# Patient Record
Sex: Male | Born: 1960
Health system: Southern US, Community
[De-identification: ages and names within clinical notes are randomized; demographics above are authoritative.]

## PROBLEM LIST (undated history)

## (undated) ENCOUNTER — Ambulatory Visit (HOSPITAL_COMMUNITY): Discharge status: Absent without leave | Payer: No Typology Code available for payment source

## (undated) DIAGNOSIS — E785 Hyperlipidemia, unspecified: Secondary | ICD-10-CM

## (undated) DIAGNOSIS — M1990 Unspecified osteoarthritis, unspecified site: Secondary | ICD-10-CM

## (undated) DIAGNOSIS — I10 Essential (primary) hypertension: Secondary | ICD-10-CM

## (undated) DIAGNOSIS — K219 Gastro-esophageal reflux disease without esophagitis: Secondary | ICD-10-CM

## (undated) HISTORY — PX: OTHER SURGICAL HISTORY: SHX169

## (undated) HISTORY — DX: Gastro-esophageal reflux disease without esophagitis: K21.9

## (undated) HISTORY — DX: Essential (primary) hypertension: I10

## (undated) HISTORY — DX: Hyperlipidemia, unspecified: E78.5

---

## 2007-01-26 ENCOUNTER — Emergency Department (HOSPITAL_COMMUNITY): Admission: EM | Admit: 2007-01-26 | Discharge: 2007-01-26 | Payer: Self-pay | Admitting: Emergency Medicine

## 2007-01-28 ENCOUNTER — Emergency Department (HOSPITAL_COMMUNITY): Admission: EM | Admit: 2007-01-28 | Discharge: 2007-01-28 | Payer: Self-pay | Admitting: Emergency Medicine

## 2007-11-25 ENCOUNTER — Emergency Department (HOSPITAL_COMMUNITY): Admission: EM | Admit: 2007-11-25 | Discharge: 2007-11-25 | Payer: Self-pay | Admitting: Family Medicine

## 2008-02-23 ENCOUNTER — Emergency Department (HOSPITAL_COMMUNITY): Admission: EM | Admit: 2008-02-23 | Discharge: 2008-02-23 | Payer: Self-pay | Admitting: Family Medicine

## 2008-02-26 ENCOUNTER — Emergency Department (HOSPITAL_COMMUNITY): Admission: EM | Admit: 2008-02-26 | Discharge: 2008-02-27 | Payer: Self-pay | Admitting: Emergency Medicine

## 2009-04-01 ENCOUNTER — Emergency Department (HOSPITAL_COMMUNITY): Admission: EM | Admit: 2009-04-01 | Discharge: 2009-04-01 | Payer: Self-pay | Admitting: Family Medicine

## 2010-03-26 ENCOUNTER — Emergency Department (HOSPITAL_COMMUNITY): Admission: EM | Admit: 2010-03-26 | Discharge: 2010-03-26 | Payer: Self-pay | Admitting: Emergency Medicine

## 2010-08-20 LAB — POCT URINALYSIS DIP (DEVICE)
Glucose, UA: NEGATIVE mg/dL
Protein, ur: NEGATIVE mg/dL
Urobilinogen, UA: 2 mg/dL — ABNORMAL HIGH (ref 0.0–1.0)

## 2011-02-17 LAB — LIPASE, BLOOD: Lipase: 36

## 2011-02-17 LAB — POCT H PYLORI SCREEN: H. PYLORI SCREEN, POC: NEGATIVE

## 2011-02-17 LAB — URINALYSIS, ROUTINE W REFLEX MICROSCOPIC
Bilirubin Urine: NEGATIVE
Ketones, ur: NEGATIVE
Nitrite: NEGATIVE
Protein, ur: NEGATIVE
Urobilinogen, UA: 1

## 2011-02-17 LAB — COMPREHENSIVE METABOLIC PANEL
Albumin: 4.3
Alkaline Phosphatase: 50
BUN: 12
Calcium: 9.4
Creatinine, Ser: 0.97
Glucose, Bld: 114 — ABNORMAL HIGH
Total Protein: 7.5

## 2011-02-26 ENCOUNTER — Emergency Department (HOSPITAL_COMMUNITY)
Admission: EM | Admit: 2011-02-26 | Discharge: 2011-02-26 | Disposition: A | Discharge status: Home / Self Care | Payer: Self-pay | Attending: Emergency Medicine | Admitting: Emergency Medicine

## 2011-02-26 DIAGNOSIS — L02419 Cutaneous abscess of limb, unspecified: Secondary | ICD-10-CM | POA: Insufficient documentation

## 2011-02-26 DIAGNOSIS — L03119 Cellulitis of unspecified part of limb: Secondary | ICD-10-CM | POA: Insufficient documentation

## 2011-03-20 ENCOUNTER — Emergency Department (HOSPITAL_COMMUNITY)
Admission: EM | Admit: 2011-03-20 | Discharge: 2011-03-20 | Disposition: A | Discharge status: Home / Self Care | Payer: Self-pay | Attending: Emergency Medicine | Admitting: Emergency Medicine

## 2011-03-20 DIAGNOSIS — L02419 Cutaneous abscess of limb, unspecified: Secondary | ICD-10-CM | POA: Insufficient documentation

## 2011-03-20 DIAGNOSIS — L03119 Cellulitis of unspecified part of limb: Secondary | ICD-10-CM | POA: Insufficient documentation

## 2011-03-20 DIAGNOSIS — M79609 Pain in unspecified limb: Secondary | ICD-10-CM | POA: Insufficient documentation

## 2011-10-14 ENCOUNTER — Emergency Department (HOSPITAL_COMMUNITY)
Admission: EM | Admit: 2011-10-14 | Discharge: 2011-10-14 | Disposition: A | Discharge status: Home / Self Care | Payer: Self-pay | Attending: Emergency Medicine | Admitting: Emergency Medicine

## 2011-10-14 ENCOUNTER — Encounter (HOSPITAL_COMMUNITY): Payer: Self-pay | Admitting: *Deleted

## 2011-10-14 DIAGNOSIS — K0889 Other specified disorders of teeth and supporting structures: Secondary | ICD-10-CM

## 2011-10-14 DIAGNOSIS — K029 Dental caries, unspecified: Secondary | ICD-10-CM | POA: Insufficient documentation

## 2011-10-14 DIAGNOSIS — K089 Disorder of teeth and supporting structures, unspecified: Secondary | ICD-10-CM | POA: Insufficient documentation

## 2011-10-14 LAB — SALICYLATE LEVEL: Salicylate Lvl: 14.2 mg/dL (ref 2.8–20.0)

## 2011-10-14 MED ORDER — OXYCODONE-ACETAMINOPHEN 5-325 MG PO TABS: 1.0000 | ORAL_TABLET | Freq: Once | ORAL | Status: DC

## 2011-10-14 MED ORDER — OXYCODONE-ACETAMINOPHEN 5-325 MG PO TABS
1.0000 | ORAL_TABLET | Freq: Once | ORAL | Status: AC
  Administered 2011-10-14: 1 via ORAL
  Filled 2011-10-14: qty 1

## 2011-10-14 MED ORDER — OXYCODONE-ACETAMINOPHEN 5-325 MG PO TABS: 1.0000 | ORAL_TABLET | ORAL | Status: AC | PRN

## 2011-10-14 MED ORDER — PENICILLIN V POTASSIUM 500 MG PO TABS: 500.0000 mg | ORAL_TABLET | Freq: Four times a day (QID) | ORAL | Status: DC

## 2011-10-14 MED ORDER — PENICILLIN V POTASSIUM 500 MG PO TABS: 500.0000 mg | ORAL_TABLET | Freq: Three times a day (TID) | ORAL | Status: DC

## 2011-10-14 MED ORDER — PENICILLIN V POTASSIUM 500 MG PO TABS: 500.0000 mg | ORAL_TABLET | Freq: Four times a day (QID) | ORAL | Status: AC

## 2011-10-14 NOTE — ED Notes (Signed)
C.o toothache onset several days ago . States he took 6 bc powders in the last several hours.

## 2011-10-14 NOTE — ED Provider Notes (Signed)
History     CSN: [id number]  Arrival date & time 10/14/11  3244   First MD Initiated Contact with Patient 10/14/11 [phone]      Chief Complaint  Patient presents with  . Dental Pain    (Consider location/radiation/quality/duration/timing/severity/associated sxs/prior treatment) HPI  Patient presents to the Emergency Department with complaints of dental pain at the right lower jaw. He denies having a history of dental pain but does have dental disease. He admits to taking 6 BC powders over the past 5 hours, 1 hour in between each pack. He denies taking any other medications. The patient took 1 BC powder every 5 hours until he decided to come to the ED. He states he was not trying to overdose he was trying not to be in pain, he denies having any abdominal pain, vomiting, nausea, diarrhea, history of ulcers or bleeding disorders. Pt able to open mouth and eat without any difficulty but does have pain. Pt appears uncomfortable but non toxic. VSS.    History reviewed. No pertinent past medical history.  History reviewed. No pertinent past surgical history.  History reviewed. No pertinent family history.  History  Substance Use Topics  . Smoking status: Current Some Day Smoker  . Smokeless tobacco: Not on file  . Alcohol Use: Yes      Review of Systems   HEENT: denies blurry vision or change in hearing PULMONARY: Denies difficulty breathing and SOB CARDIAC: denies chest pain or heart palpitations MUSCULOSKELETAL:  denies being unable to ambulate ABDOMEN AL: denies abdominal pain GU: denies loss of bowel or urinary control NEURO: denies numbness and tingling in extremities   Allergies  Review of patient's allergies indicates no known allergies.  Home Medications   Current Outpatient Rx  Name Route Sig Dispense Refill  . BC HEADACHE POWDER PO Oral Take 1 packet by mouth daily as needed. For pain    . PENICILLIN V POTASSIUM 500 MG PO TABS Oral Take 1 tablet (500 mg total)  by mouth 4 (four) times daily. 40 tablet 0  . PENICILLIN V POTASSIUM 500 MG PO TABS Oral Take 1 tablet (500 mg total) by mouth 3 (three) times daily. 30 tablet 0    BP 129/95  Pulse 87  Temp(Src) 99.1 F (37.3 C) (Oral)  Resp 22  SpO2 100%  Physical Exam  Nursing note and vitals reviewed. Constitutional: He appears well-developed and well-nourished.  HENT:  Head: Normocephalic and atraumatic.  Mouth/Throat: Dental caries present.  Eyes: Conjunctivae and EOM are normal. Pupils are equal, round, and reactive to light.  Neck: Normal range of motion. Neck supple.  Cardiovascular: Normal rate and regular rhythm.   Pulmonary/Chest: Effort normal and breath sounds normal.    ED Course  Procedures (including critical care time)   Labs Reviewed  SALICYLATE LEVEL   No results found.   1. Pain, dental       MDM  Salicylate level ordered and result is 14.2. Pt given a lot of patient education about appropriate use of medications and how overdosing is very dangerous. Case discussed with Dr. Hyacinth Meeker.  Pt given Rx for Percocets 5-325 (10 tabs) and Penicillin. Patient informed that they need to find a dentist and have the tooth pulled or the symptoms may be reoccurring. A Resource guide has been given with dental providers. Patient has been given return to ED precautions.         Dorthula Matas, PA 10/14/11 [phone]

## 2011-10-14 NOTE — Discharge Instructions (Signed)
Dental Pain A tooth ache may be caused by cavities (tooth decay). Cavities expose the nerve of the tooth to air and hot or cold temperatures. It may come from an infection or abscess (also called a boil or furuncle) around your tooth. It is also often caused by dental caries (tooth decay). This causes the pain you are having. DIAGNOSIS  Your caregiver can diagnose this problem by exam. TREATMENT   If caused by an infection, it may be treated with medications which kill germs (antibiotics) and pain medications as prescribed by your caregiver. Take medications as directed.   Only take over-the-counter or prescription medicines for pain, discomfort, or fever as directed by your caregiver.   Whether the tooth ache today is caused by infection or dental disease, you should see your dentist as soon as possible for further care.  SEEK MEDICAL CARE IF: The exam and treatment you received today has been provided on an emergency basis only. This is not a substitute for complete medical or dental care. If your problem worsens or new problems (symptoms) appear, and you are unable to meet with your dentist, call or return to this location. SEEK IMMEDIATE MEDICAL CARE IF:   You have a fever.   You develop redness and swelling of your face, jaw, or neck.   You are unable to open your mouth.   You have severe pain uncontrolled by pain medicine.  MAKE SURE YOU:   Understand these instructions.   Will watch your condition.   Will get help right away if you are not doing well or get worse.  Document Released: 05/04/2005 Document Revised: 04/23/2011 Document Reviewed: 12/21/2007 ExitCare Patient Information 2012 [employer].  RESOURCE GUIDE  Dental Problems  Patients with Medicaid: [facility]                     [facility] [address]                                           [address] Phone:  [phone]                                                   Phone:  [phone]  If unable to pay or uninsured, contact:  [facility] or [facility] to become qualified for the adult dental clinic.  Chronic Pain Problems Contact [facility]  [phone] Patients need to be referred by their primary care doctor.  Insufficient Money for Medicine Contact [employer]:  call "211" or [facility] 271-5999.  No Primary Care Doctor Call [facility]  832-8000 Other agencies that provide inexpensive medical care    [facility]  832-8035    [facility]  [phone]    [facility]  [phone]    [facility]  832-4777    [facility]  [phone]    [facility]  [phone]  Psychological Services [facility]  [phone] [facility]  [phone] [facility]   800 853-5163 (emergency services 641-4993)  Substance Abuse Resources [facility]  336-882-2125 [facility] 336-784-9470 [facility] 336-285-9073 [facility]   336-845-3988 Residential & Outpatient Substance Abuse Program  800-659-3381  Abuse/Neglect [place] Child Abuse Hotline (336) 641-3795 [place] [facility] Abuse Hotline 800-378-5315 (After Hours)  Emergency Shelter [facility] (336) 271-5985  [facility] (336) 275-9566 [facility] (704) 372-4663  MRSA Hotline #:   832-7006    [place] [facility]  [facility] [place]     [employer]                          [facility] [address] [place]                       [address]      [address]  [place]                                                [place]                            [place] Phone:  [phone]                                   Phone:  [phone]                 Phone:  [phone]  [facility] Phone:   [phone]  [place] Child Abuse Hotline (336) 342-1394 (336) 342-3537 (After Hours)   

## 2011-10-15 NOTE — ED Provider Notes (Signed)
Medical screening examination/treatment/procedure(s) were performed by non-physician practitioner and as supervising physician I was immediately available for consultation/collaboration.    Vida Roller, MD 10/15/11 [phone]

## 2012-02-10 ENCOUNTER — Encounter (HOSPITAL_COMMUNITY): Payer: Self-pay | Admitting: Adult Health

## 2012-02-10 ENCOUNTER — Emergency Department (HOSPITAL_COMMUNITY)
Admission: EM | Admit: 2012-02-10 | Discharge: 2012-02-11 | Disposition: A | Discharge status: Home / Self Care | Payer: Self-pay | Attending: Emergency Medicine | Admitting: Emergency Medicine

## 2012-02-10 DIAGNOSIS — F17200 Nicotine dependence, unspecified, uncomplicated: Secondary | ICD-10-CM | POA: Insufficient documentation

## 2012-02-10 DIAGNOSIS — K089 Disorder of teeth and supporting structures, unspecified: Secondary | ICD-10-CM | POA: Insufficient documentation

## 2012-02-10 DIAGNOSIS — K0889 Other specified disorders of teeth and supporting structures: Secondary | ICD-10-CM

## 2012-02-10 DIAGNOSIS — K029 Dental caries, unspecified: Secondary | ICD-10-CM | POA: Insufficient documentation

## 2012-02-10 MED ORDER — ONDANSETRON 4 MG PO TBDP
ORAL_TABLET | ORAL | Status: AC
  Administered 2012-02-10: 8 mg via ORAL
  Filled 2012-02-10: qty 2

## 2012-02-10 MED ORDER — ONDANSETRON 4 MG PO TBDP
8.0000 mg | ORAL_TABLET | Freq: Once | ORAL | Status: AC
  Administered 2012-02-10: 8 mg via ORAL

## 2012-02-10 NOTE — ED Notes (Signed)
Pt c/o toothache x3days, states it is making him nauseous and he can't eat food. Pt states he has dentist appt Friday but can't wait-is in too much pain. No swelling noted. Pt states pain is on right side, doesn't know if it is upper or lower.

## 2012-02-10 NOTE — ED Notes (Signed)
Reports taking Ibuprofen and 2 days of PCN for toothache and constant use of orajel. C/o upset stomach and pain on upper and lower back teeth. Has been using 200 mg IBuprofen and taking 1-2 pills every few hours. Has not been able to eat due to tooth pain.

## 2012-02-10 NOTE — ED Provider Notes (Signed)
History     CSN: [id number]  Arrival date & time 02/10/12  2043   First MD Initiated Contact with Patient 02/10/12 2205      Chief Complaint  Patient presents with  . Dental Pain   HPI  History provided by the patient. Patient is a 51 year old male with no significant PMH presents with complaints of right lower dental pain. Patient states pain has been increasing for the past several days. He has past history of off-and-on pains to his teeth. Patient states that he does have an appointment with the dentist this Friday. He has been taking penicillin ibuprofen and Orajel. This has helped some but not significantly tonight. Wait to see the tenderness because pain is too severe. He denies any swelling of the gums are common. Denies any fever, chills or sweats.    History reviewed. No pertinent past medical history.  History reviewed. No pertinent past surgical history.  History reviewed. No pertinent family history.  History  Substance Use Topics  . Smoking status: Current Some Day Smoker  . Smokeless tobacco: Not on file  . Alcohol Use: Yes      Review of Systems  Constitutional: Negative for fever and chills.  HENT: Positive for dental problem. Negative for trouble swallowing and voice change.   Gastrointestinal: Positive for nausea. Negative for vomiting.    Allergies  Review of patient's allergies indicates no known allergies.  Home Medications   Current Outpatient Rx  Name Route Sig Dispense Refill  . BENZOCAINE 10 % MT GEL Mouth/Throat Use as directed 1 application in the mouth or throat 3 (three) times daily as needed. For pain    . IBUPROFEN 200 MG PO TABS Oral Take 400 mg by mouth every 6 (six) hours as needed. For pain    . PENICILLIN V POTASSIUM 500 MG PO TABS Oral Take 500 mg by mouth 4 (four) times daily.      BP 133/95  Pulse 78  Temp 98.6 F (37 C) (Oral)  Resp 16  SpO2 97%  Physical Exam  Nursing note and vitals reviewed. Constitutional: He is  oriented to person, place, and time. He appears well-developed and well-nourished.  HENT:  Head: Normocephalic.  Mouth/Throat:         Poor dentition throughout. Multiple dental caries present. There is significant decay of the right lower second premolar to the gumline. There is tenderness around this area and to adjacent teeth.   Patient also with pain to percussion of right upper second molar area. No swelling of adjacent gums or mouth.  No significant swelling of the gums and no swelling of the tongue.  Neck: Normal range of motion.  Cardiovascular: Normal rate and regular rhythm.   Pulmonary/Chest: Effort normal and breath sounds normal.  Lymphadenopathy:    He has no cervical adenopathy.  Neurological: He is alert and oriented to person, place, and time.  Skin: Skin is warm.  Psychiatric: He has a normal mood and affect. His behavior is normal.    ED Course  Procedures   Dental Block Performed by: Angus Seller Authorized by: Angus Seller Consent: Verbal consent obtained. Risks and benefits: risks, benefits and alternatives were discussed Consent given by: patient Patient identity confirmed: provided demographic data  Location: Right lower second premolar  Local anesthetic: Bupivacaine 0.5% with epinephrine  Anesthetic total: 1.8 ml  Irrigation method: syringe  Patient tolerance: Patient tolerated the procedure well with no immediate complications. Pain improved.  Dental Block Performed by: Angus Seller Authorized  by: Isaih Bulger S Consent: Verbal consent obtained. Risks and benefits: risks, benefits and alternatives were discussed Consent given by: patient Patient identity confirmed: provided demographic data  Location: Right upper second molar  Local anesthetic: Bupivacaine 0.5% with epinephrine  Anesthetic total: 1.8 ml  Irrigation method: syringe  Patient tolerance: Patient tolerated the procedure well with no immediate complications. Pain  improved.       1. Pain, dental       MDM  11:40PM patient seen and evaluated. Patient given dental block with good improvement of pain. Patient reports one of his dentist this Friday.        Angus Seller, [place] 02/11/12 [phone]

## 2012-02-11 MED ORDER — HYDROCODONE-ACETAMINOPHEN 5-325 MG PO TABS: 1.0000 | ORAL_TABLET | ORAL | Status: DC | PRN

## 2012-02-11 MED ORDER — ONDANSETRON 8 MG PO TBDP: ORAL_TABLET | ORAL | Status: DC

## 2012-02-11 NOTE — ED Provider Notes (Signed)
Medical screening examination/treatment/procedure(s) were performed by non-physician practitioner and as supervising physician I was immediately available for consultation/collaboration.   Gwyneth Sprout, MD 02/11/12 1410

## 2012-02-11 NOTE — ED Notes (Signed)
Pt denies any pain or questions upon discharge. 

## 2013-08-02 ENCOUNTER — Emergency Department (HOSPITAL_COMMUNITY)
Admission: EM | Admit: 2013-08-02 | Discharge: 2013-08-02 | Disposition: A | Discharge status: Home / Self Care | Payer: BC Managed Care – PPO | Attending: Emergency Medicine | Admitting: Emergency Medicine

## 2013-08-02 ENCOUNTER — Emergency Department (HOSPITAL_COMMUNITY): Payer: BC Managed Care – PPO

## 2013-08-02 ENCOUNTER — Encounter (HOSPITAL_COMMUNITY): Payer: Self-pay | Admitting: Emergency Medicine

## 2013-08-02 DIAGNOSIS — Z8739 Personal history of other diseases of the musculoskeletal system and connective tissue: Secondary | ICD-10-CM | POA: Insufficient documentation

## 2013-08-02 DIAGNOSIS — R1011 Right upper quadrant pain: Secondary | ICD-10-CM | POA: Insufficient documentation

## 2013-08-02 DIAGNOSIS — R1013 Epigastric pain: Secondary | ICD-10-CM | POA: Insufficient documentation

## 2013-08-02 DIAGNOSIS — R109 Unspecified abdominal pain: Secondary | ICD-10-CM

## 2013-08-02 DIAGNOSIS — F17200 Nicotine dependence, unspecified, uncomplicated: Secondary | ICD-10-CM | POA: Insufficient documentation

## 2013-08-02 HISTORY — DX: Unspecified osteoarthritis, unspecified site: M19.90

## 2013-08-02 LAB — URINALYSIS, ROUTINE W REFLEX MICROSCOPIC
BILIRUBIN URINE: NEGATIVE
Glucose, UA: NEGATIVE mg/dL
Hgb urine dipstick: NEGATIVE
KETONES UR: NEGATIVE mg/dL
LEUKOCYTES UA: NEGATIVE
NITRITE: NEGATIVE
PH: 5.5 (ref 5.0–8.0)
PROTEIN: NEGATIVE mg/dL
Specific Gravity, Urine: 1.027 (ref 1.005–1.030)
UROBILINOGEN UA: 1 mg/dL (ref 0.0–1.0)

## 2013-08-02 LAB — COMPREHENSIVE METABOLIC PANEL
ALT: 34 U/L (ref 0–53)
AST: 36 U/L (ref 0–37)
Albumin: 4.2 g/dL (ref 3.5–5.2)
Alkaline Phosphatase: 57 U/L (ref 39–117)
BUN: 11 mg/dL (ref 6–23)
CALCIUM: 9.5 mg/dL (ref 8.4–10.5)
CO2: 28 mEq/L (ref 19–32)
CREATININE: 0.87 mg/dL (ref 0.50–1.35)
Chloride: 101 mEq/L (ref 96–112)
GLUCOSE: 105 mg/dL — AB (ref 70–99)
Potassium: 4.3 mEq/L (ref 3.7–5.3)
SODIUM: 138 meq/L (ref 137–147)
TOTAL PROTEIN: 7.7 g/dL (ref 6.0–8.3)
Total Bilirubin: 0.7 mg/dL (ref 0.3–1.2)

## 2013-08-02 LAB — CBC WITH DIFFERENTIAL/PLATELET
Basophils Absolute: 0 10*3/uL (ref 0.0–0.1)
Basophils Relative: 0 % (ref 0–1)
EOS ABS: 0.1 10*3/uL (ref 0.0–0.7)
EOS PCT: 2 % (ref 0–5)
HEMATOCRIT: 40.5 % (ref 39.0–52.0)
Hemoglobin: 13.8 g/dL (ref 13.0–17.0)
LYMPHS ABS: 2.1 10*3/uL (ref 0.7–4.0)
Lymphocytes Relative: 36 % (ref 12–46)
MCH: 30.6 pg (ref 26.0–34.0)
MCHC: 34.1 g/dL (ref 30.0–36.0)
MCV: 89.8 fL (ref 78.0–100.0)
MONO ABS: 0.7 10*3/uL (ref 0.1–1.0)
Monocytes Relative: 11 % (ref 3–12)
Neutro Abs: 2.9 10*3/uL (ref 1.7–7.7)
Neutrophils Relative %: 51 % (ref 43–77)
PLATELETS: 187 10*3/uL (ref 150–400)
RBC: 4.51 MIL/uL (ref 4.22–5.81)
RDW: 12.9 % (ref 11.5–15.5)
WBC: 5.8 10*3/uL (ref 4.0–10.5)

## 2013-08-02 LAB — I-STAT TROPONIN, ED: TROPONIN I, POC: 0 ng/mL (ref 0.00–0.08)

## 2013-08-02 LAB — LIPASE, BLOOD: LIPASE: 44 U/L (ref 11–59)

## 2013-08-02 NOTE — ED Provider Notes (Signed)
CSN: [id number]     Arrival date & time 08/02/13  1046 History   First MD Initiated Contact with Patient 08/02/13 1339     Chief Complaint  Patient presents with  . Abdominal Pain     (Consider location/radiation/quality/duration/timing/severity/associated sxs/prior Treatment) The history is provided by the patient.   This is a 53 year old male with no significant past medical history presenting to the ED for abdominal pain. Patient states he intermittently develops pain across his upper abdomen over the past 3 weeks.  States pain varies in nature, sometimes sharp other times aching.  States once he eats, pain improves. He denies any nausea, vomiting or diarrhea. No fever or chills. No flank pain or dysuria.  Patient without prior abdominal surgeries.  BM normal.  Does take occasional gas-x for gas pains but none in the past several days.  Denies GERD sx. VS stable on arrival.  Past Medical History  Diagnosis Date  . Arthritis    No past surgical history on file. No family history on file. History  Substance Use Topics  . Smoking status: Current Some Day Smoker    Types: Cigarettes  . Smokeless tobacco: Not on file  . Alcohol Use: No    Review of Systems  Gastrointestinal: Positive for abdominal pain.  All other systems reviewed and are negative.      Allergies  Review of patient's allergies indicates no known allergies.  Home Medications  No current outpatient prescriptions on file. Pulse 61  Temp(Src) 98.3 F (36.8 C) (Oral)  Resp 18  Ht 5\' 9"  (1.753 m)  Wt 161 lb (73.029 kg)  BMI 23.76 kg/m2  SpO2 100%  Physical Exam  Nursing note and vitals reviewed. Constitutional: He is oriented to person, place, and time. He appears well-developed and well-nourished. No distress.  HENT:  Head: Normocephalic and atraumatic.  Mouth/Throat: Oropharynx is clear and moist.  Eyes: Conjunctivae and EOM are normal. Pupils are equal, round, and reactive to light.  Neck: Normal  range of motion. Neck supple.  Cardiovascular: Normal rate, regular rhythm and normal heart sounds.   Pulmonary/Chest: Effort normal and breath sounds normal. No respiratory distress. He has no wheezes.  Abdominal: Soft. Bowel sounds are normal. There is tenderness in the right upper quadrant and epigastric area. There is no guarding and negative Murphy's sign.  Abdomen soft, nondistended, mild tenderness to palpation of right upper quadrant and epigastrium  Musculoskeletal: Normal range of motion. He exhibits no edema.  Neurological: He is alert and oriented to person, place, and time.  Skin: Skin is warm and dry. He is not diaphoretic.  Psychiatric: He has a normal mood and affect.    ED Course  Procedures (including critical care time) Labs Review Labs Reviewed  COMPREHENSIVE METABOLIC PANEL - Abnormal; Notable for the following:    Glucose, Bld 105 (*)    All other components within normal limits  CBC WITH DIFFERENTIAL  LIPASE, BLOOD  URINALYSIS, ROUTINE W REFLEX MICROSCOPIC  I-STAT TROPOININ, ED   Imaging Review US Abdomen Complete  08/02/2013   CLINICAL DATA:  ABDOMINAL PAIN  EXAM: ULTRASOUND ABDOMEN COMPLETE  COMPARISON:  None.  FINDINGS: Gallbladder:  No gallstones or wall thickening visualized, measuring 1.9 mm. No sonographic Murphy sign noted.  Common bile duct:  Diameter: 4.7 mm  Liver:  No focal lesion identified. Within normal limits in parenchymal echogenicity.  IVC:  No abnormality visualized.  Pancreas:  Visualized portion unremarkable.  Spleen:  Size and appearance within normal limits.  Right  Kidney:  Length: 10.1 cm.  Echogenicity within normal limits. No mass or hydronephrosis visualized.  Left Kidney:  Length: 11.8 cm.  Echogenicity within normal limits. No mass or hydronephrosis visualized.  Abdominal aorta:  No aneurysm visualized.  Other findings:  None.  IMPRESSION: The unremarkable abdominal ultrasound.   Electronically Signed   By: Salome HolmesHector  Cooper M.D.   On:  08/02/2013 15:50     EKG Interpretation None      MDM   Final diagnoses:  Abdominal pain   Labs reassuring.  Pt afebrile and overall non-toxic appearing, he does have some mild TTP of RUQ and epigastrium on exam.  Will obtain abdominal ultrasound for further evaluation.  Abdominal ultrasound negative for acute findings.  Patient has remained afebrile and overall nontoxic appearing. At this time I have a suspicion for acute or surgical abdomen including, but not limited to, cholecystitis, diverticulitis, small bowel traction, bowel perforation, or appendicitis. Instructed patient that he may continue taking over-the-counter Gas-X or TUMS as needed for intermittent abdominal discomfort. He may follow with GI for further evaluation if desired, referral provided. Discussed plan with patient, he acknowledged understanding and agreed with plan of care.  Garlon HatchetLisa M Katherine Tout, PA-C 08/02/13 1603  Garlon HatchetLisa M Chestina Komatsu, PA-C 08/02/13 [phone]

## 2013-08-02 NOTE — ED Provider Notes (Signed)
Medical screening examination/treatment/procedure(s) were performed by non-physician practitioner and as supervising physician I was immediately available for consultation/collaboration.   EKG Interpretation None       Gilda Creasehristopher J. Jase Himmelberger, MD 08/02/13 [phone]

## 2013-08-02 NOTE — Discharge Instructions (Signed)
Your work-up today was negative. If you continue having symptoms or would like further evaluation, you may follow-up with GI-- call and scheduled appt if desired. Return to the ED for new concerns.

## 2013-08-02 NOTE — ED Notes (Signed)
PA at bedside.

## 2013-08-02 NOTE — ED Notes (Signed)
Pt reports 5/10 generalized abdominal pain x 3 weeks. Denies N/V/D. Denies fever/chills/dysuria. States that eating improves pain.

## 2013-08-14 ENCOUNTER — Encounter: Payer: Self-pay | Admitting: Gastroenterology

## 2013-08-31 ENCOUNTER — Emergency Department (INDEPENDENT_AMBULATORY_CARE_PROVIDER_SITE_OTHER)
Admission: EM | Admit: 2013-08-31 | Discharge: 2013-08-31 | Disposition: A | Discharge status: Home / Self Care | Payer: BC Managed Care – PPO | Source: Home / Self Care | Attending: Family Medicine | Admitting: Family Medicine

## 2013-08-31 ENCOUNTER — Encounter (HOSPITAL_COMMUNITY): Payer: Self-pay | Admitting: Emergency Medicine

## 2013-08-31 DIAGNOSIS — H6092 Unspecified otitis externa, left ear: Secondary | ICD-10-CM

## 2013-08-31 DIAGNOSIS — H60399 Other infective otitis externa, unspecified ear: Secondary | ICD-10-CM

## 2013-08-31 DIAGNOSIS — H6120 Impacted cerumen, unspecified ear: Secondary | ICD-10-CM

## 2013-08-31 DIAGNOSIS — H6123 Impacted cerumen, bilateral: Secondary | ICD-10-CM

## 2013-08-31 MED ORDER — CIPROFLOXACIN-DEXAMETHASONE 0.3-0.1 % OT SUSP: 4.0000 [drp] | Freq: Two times a day (BID) | OTIC | Status: DC

## 2013-08-31 NOTE — Discharge Instructions (Signed)
Otitis Externa  Otitis externa is a germ infection in the outer ear. The outer ear is the area from the eardrum to the outside of the ear. Otitis externa is sometimes called "swimmer's ear."  HOME CARE   Put drops in the ear as told by your doctor.   Only take medicine as told by your doctor.   If you have diabetes, your doctor may give you more directions. Follow your doctor's directions.   Keep all doctor visits as told.  To avoid another infection:   Keep your ear dry. Use the corner of a towel to dry your ear after swimming or bathing.   Avoid scratching or putting things inside your ear.   Avoid swimming in lakes, dirty water, or pools that use a chemical called chlorine poorly.   You may use ear drops after swimming. Combine equal amounts of white vinegar and alcohol in a bottle. Put 3 or 4 drops in each ear.  GET HELP RIGHT AWAY IF:    You have a fever.   Your ear is still red, puffy (swollen), or painful after 3 days.   You still have yellowish-white fluid (pus) coming from the ear after 3 days.   Your redness, puffiness, or pain gets worse.   You have a really bad headache.   You have redness, puffiness, pain, or tenderness behind your ear.  MAKE SURE YOU:    Understand these instructions.   Will watch your condition.   Will get help right away if you are not doing well or get worse.  Document Released: 10/21/2007 Document Revised: 07/27/2011 Document Reviewed: 05/21/2011  [employer] Patient Information 2014 [employer].

## 2013-08-31 NOTE — ED Notes (Signed)
Pharmacy called requesting drug substitution.  Dr Denyse Amasscorey spoke directly to the pharmacist to arrange/order drug change

## 2013-08-31 NOTE — ED Notes (Signed)
C/o left ear pain for 2-3 weeks States ear is swollen and achy Denies any drainage States peroxide was used Ibuprofen was taking for pain States hearing is muffled

## 2013-08-31 NOTE — ED Provider Notes (Signed)
CSN: [id number]     Arrival date & time 08/31/13  [id number] History   First MD Initiated Contact with Patient 08/31/13 1223     Chief Complaint  Patient presents with  . Otalgia   (Consider location/radiation/quality/duration/timing/severity/associated sxs/prior Treatment) Patient is a 53 y.o. male presenting with ear pain. The history is provided by the patient.  Otalgia Location:  Left Behind ear:  Swelling Quality:  Aching Severity:  Moderate Onset quality:  Gradual Duration:  2 weeks Timing:  Constant Progression:  Worsening Chronicity:  New Relieved by:  Nothing Worsened by:  Palpation Ineffective treatments: peroxide. Associated symptoms: no congestion, no ear discharge, no fever and no rhinorrhea   Associated symptoms comment:  Decreased hearing   Past Medical History  Diagnosis Date  . Arthritis    History reviewed. No pertinent past surgical history. History reviewed. No pertinent family history. History  Substance Use Topics  . Smoking status: Current Some Day Smoker    Types: Cigarettes  . Smokeless tobacco: Not on file  . Alcohol Use: No    Review of Systems  Constitutional: Negative for fever and chills.  HENT: Positive for ear pain. Negative for congestion, ear discharge and rhinorrhea.     Allergies  Review of patient's allergies indicates no known allergies.  Home Medications   Prior to Admission medications   Medication Sig Start Date End Date Taking? Authorizing Provider  ciprofloxacin-dexamethasone (CIPRODEX) otic suspension Place 4 drops into the left ear 2 (two) times daily. 08/31/13   Cathlyn ParsonsAngela M Kabbe, NP   BP 120/85  Pulse 60  Temp(Src) 98.1 F (36.7 C) (Oral)  Resp 18  SpO2 100% Physical Exam  Constitutional: He appears well-developed and well-nourished. No distress.  HENT:  Right Ear: Tympanic membrane, external ear and ear canal normal.  R ear with cerumen impaction. Removed by MA. TM is normal. L ear with cerumen impaction. Canal  narrowed by edema, pain with palpation pinna and tragus of L ear. No drainage noted. Cerumen removed by MA. Unable to see TM.   Pulmonary/Chest: Effort normal.  Lymphadenopathy:       Head (right side): No submental, no submandibular, no tonsillar, no preauricular and no posterior auricular adenopathy present.       Head (left side): No submental, no submandibular, no tonsillar, no preauricular and no posterior auricular adenopathy present.    ED Course  Procedures (including critical care time) Labs Review Labs Reviewed - No data to display  Results for orders placed during the hospital encounter of 08/02/13  CBC WITH DIFFERENTIAL      Result Value Ref Range   WBC 5.8  4.0 - 10.5 K/uL   RBC 4.51  4.22 - 5.81 MIL/uL   Hemoglobin 13.8  13.0 - 17.0 g/dL   HCT 21.340.5  08.639.0 - 57.852.0 %   MCV 89.8  78.0 - 100.0 fL   MCH 30.6  26.0 - 34.0 pg   MCHC 34.1  30.0 - 36.0 g/dL   RDW 46.912.9  62.911.5 - 52.815.5 %   Platelets 187  150 - 400 K/uL   Neutrophils Relative % 51  43 - 77 %   Neutro Abs 2.9  1.7 - 7.7 K/uL   Lymphocytes Relative 36  12 - 46 %   Lymphs Abs 2.1  0.7 - 4.0 K/uL   Monocytes Relative 11  3 - 12 %   Monocytes Absolute 0.7  0.1 - 1.0 K/uL   Eosinophils Relative 2  0 - 5 %  Eosinophils Absolute 0.1  0.0 - 0.7 K/uL   Basophils Relative 0  0 - 1 %   Basophils Absolute 0.0  0.0 - 0.1 K/uL  COMPREHENSIVE METABOLIC PANEL      Result Value Ref Range   Sodium 138  137 - 147 mEq/L   Potassium 4.3  3.7 - 5.3 mEq/L   Chloride 101  96 - 112 mEq/L   CO2 28  19 - 32 mEq/L   Glucose, Bld 105 (*) 70 - 99 mg/dL   BUN 11  6 - 23 mg/dL   Creatinine, Ser 1.610.87  0.50 - 1.35 mg/dL   Calcium 9.5  8.4 - 09.610.5 mg/dL   Total Protein 7.7  6.0 - 8.3 g/dL   Albumin 4.2  3.5 - 5.2 g/dL   AST 36  0 - 37 U/L   ALT 34  0 - 53 U/L   Alkaline Phosphatase 57  39 - 117 U/L   Total Bilirubin 0.7  0.3 - 1.2 mg/dL   GFR calc non Af Amer >90  >90 mL/min   GFR calc Af Amer >90  >90 mL/min  LIPASE, BLOOD      Result  Value Ref Range   Lipase 44  11 - 59 U/L  URINALYSIS, ROUTINE W REFLEX MICROSCOPIC      Result Value Ref Range   Color, Urine YELLOW  YELLOW   APPearance CLEAR  CLEAR   Specific Gravity, Urine 1.027  1.005 - 1.030   pH 5.5  5.0 - 8.0   Glucose, UA NEGATIVE  NEGATIVE mg/dL   Hgb urine dipstick NEGATIVE  NEGATIVE   Bilirubin Urine NEGATIVE  NEGATIVE   Ketones, ur NEGATIVE  NEGATIVE mg/dL   Protein, ur NEGATIVE  NEGATIVE mg/dL   Urobilinogen, UA 1.0  0.0 - 1.0 mg/dL   Nitrite NEGATIVE  NEGATIVE   Leukocytes, UA NEGATIVE  NEGATIVE  I-STAT TROPOININ, ED      Result Value Ref Range   Troponin i, poc 0.00  0.00 - 0.08 ng/mL   Comment 3            Imaging Review No results found.   MDM   1. Otitis externa, left   2. Impacted cerumen of both ears   rx ciprodex otic 4 drops L ear BID #7.785mL     Cathlyn ParsonsAngela M Kabbe, NP 08/31/13 1341

## 2013-09-01 NOTE — ED Provider Notes (Signed)
Medical screening examination/treatment/procedure(s) were performed by a resident physician or non-physician practitioner and as the supervising physician I was immediately available for consultation/collaboration.  Elynore Dolinski, MD    Michele Kerlin S Maat Kafer, MD 09/01/13 0823 

## 2013-09-29 ENCOUNTER — Ambulatory Visit: Payer: BC Managed Care – PPO | Admitting: Gastroenterology

## 2014-08-25 ENCOUNTER — Emergency Department (INDEPENDENT_AMBULATORY_CARE_PROVIDER_SITE_OTHER)
Admission: EM | Admit: 2014-08-25 | Discharge: 2014-08-25 | Disposition: A | Discharge status: Home / Self Care | Payer: No Typology Code available for payment source | Source: Home / Self Care | Attending: Family Medicine | Admitting: Family Medicine

## 2014-08-25 ENCOUNTER — Encounter (HOSPITAL_COMMUNITY): Payer: Self-pay | Admitting: *Deleted

## 2014-08-25 DIAGNOSIS — L089 Local infection of the skin and subcutaneous tissue, unspecified: Secondary | ICD-10-CM

## 2014-08-25 DIAGNOSIS — Z23 Encounter for immunization: Secondary | ICD-10-CM | POA: Diagnosis not present

## 2014-08-25 DIAGNOSIS — S60459A Superficial foreign body of unspecified finger, initial encounter: Secondary | ICD-10-CM

## 2014-08-25 MED ORDER — AMOXICILLIN-POT CLAVULANATE 875-125 MG PO TABS: 1.0000 | ORAL_TABLET | Freq: Two times a day (BID) | ORAL | Status: DC

## 2014-08-25 MED ORDER — TETANUS-DIPHTH-ACELL PERTUSSIS 5-2.5-18.5 LF-MCG/0.5 IM SUSP
0.5000 mL | Freq: Once | INTRAMUSCULAR | Status: AC
  Administered 2014-08-25: 0.5 mL via INTRAMUSCULAR

## 2014-08-25 MED ORDER — TETANUS-DIPHTH-ACELL PERTUSSIS 5-2.5-18.5 LF-MCG/0.5 IM SUSP
INTRAMUSCULAR | Status: AC
  Filled 2014-08-25: qty 0.5

## 2014-08-25 NOTE — Discharge Instructions (Signed)
Fingertip Infection When an infection is around the nail, it is called a paronychia. When it appears over the tip of the finger, it is called a felon. These infections are due to minor injuries or cracks in the skin. If they are not treated properly, they can lead to bone infection and permanent damage to the fingernail. Incision and drainage is necessary if a pus pocket (an abscess) has formed. Antibiotics and pain medicine may also be needed. Keep your hand elevated for the next 2-3 days to reduce swelling and pain. If a pack was placed in the abscess, it should be removed in 1-2 days by your caregiver. Soak the finger in warm water for 20 minutes 4 times daily to help promote drainage. Keep the hands as dry as possible. Wear protective gloves with cotton liners. See your caregiver for follow-up care as recommended.  HOME CARE INSTRUCTIONS   Keep wound clean, dry and dressed as suggested by your caregiver.  Soak in warm salt water for fifteen minutes, four times per day for bacterial infections.  Your caregiver will prescribe an antibiotic if a bacterial infection is suspected. Take antibiotics as directed and finish the prescription, even if the problem appears to be improving before the medicine is gone.  Only take over-the-counter or prescription medicines for pain, discomfort, or fever as directed by your caregiver. SEEK IMMEDIATE MEDICAL CARE IF:  There is redness, swelling, or increasing pain in the wound.  Pus or any other unusual drainage is coming from the wound.  An unexplained oral temperature above 102 F (38.9 C) develops.  You notice a foul smell coming from the wound or dressing. MAKE SURE YOU:   Understand these instructions.  Monitor your condition.  Contact your caregiver if you are getting worse or not improving. Document Released: 06/11/2004 Document Revised: 07/27/2011 Document Reviewed: 06/07/2008 [facility] Patient Information 2015 [place]. This  information is not intended to replace advice given to you by your health care provider. Make sure you discuss any questions you have with your health care provider.  Sliver Removal You have had a sliver (splinter) removed. This has caused a wound that extends through some or all layers of the skin and possibly into the subcutaneous tissue. This is the tissue just beneath the skin. Because these wounds can not be cleaned well, it is necessary to watch closely for infection. AFTER THE PROCEDURE  If a cut (incision) was necessary to remove this, it may have been repaired for you by your caregiver either with suturing, stapling, or adhesive strips. These keep together the skin edges and allow better and faster healing. HOME CARE INSTRUCTIONS   A dressing may have been applied. This may be changed once per day or as instructed. If the dressing sticks, it may be soaked off with a gauze pad or clean cloth that has been dampened with soapy water or hydrogen peroxide.  It is difficult to remove all slivers or foreign bodies as they may break or splinter into smaller pieces. Be aware that your body will work to remove the foreign substance. That is, the foreign body may work itself out of the wound. That is normal.  Watch for signs of infection and notify your caregiver if you suspect a sliver or foreign body remains in the wound.  You may have received a recommendation to follow up with your physician or a specialist. It is very important to call for or keep follow-up appointments in order to avoid infection or other complications.  Only take over-the-counter or prescription medicines for pain, discomfort, or fever as directed by your caregiver.  If antibiotics were prescribed, be sure to finish all of the medicine. If you did not receive a tetanus shot today because you did not recall when your last one was given, check with your caregiver in the next day or two during follow up to determine if one is  needed. SEEK MEDICAL CARE IF:   The area around the wound has new or worsening redness or tenderness.  Pus is coming from the wound  There is a foul smell from the wound or dressing  The edges of a wound that had been repaired break open SEEK IMMEDIATE MEDICAL CARE IF:   Red streaks are coming from the wound  An unexplained oral temperature above 102 F (38.9 C) develops. Document Released: 05/01/2000 Document Revised: 07/27/2011 Document Reviewed: 12/19/2007 [facility] Patient Information 2015 [place], [place]. This information is not intended to replace advice given to you by your health care provider. Make sure you discuss any questions you have with your health care provider.  Wood Splinters Wood splinters need to be removed because they can cause skin irritation and infection. If they are close to the surface, splinters can usually be removed easily. Deep splinters may be hard to locate and need treatment by a surgeon. SPLINTER REMOVAL Removal of splinters by your caregiver is considered a surgical procedure.   The area is carefully cleaned. You may require a small amount of anesthesia (medicine injected near the splinter to numb the tissue and lessen pain). After the splinter is removed, the area will be cleaned again. A bandage is applied.  If your splinter is under a fingernail or toenail, then a small section of the nail may need to be removed. As long as the splinter did not extend to the base of the nail, the nail usually grows back normally.  A splinter that is deeper, more contaminated, or that gets near a structure such as a bone, nerve or blood vessel may need to be removed by a Careers adviser.  You may need special X-rays or scans if the splinter is hard to locate.  Every attempt is made to remove the entire splinter. However, small particles may remain. Tell your caregiver if you feel that a part of the splinter was left behind. HOME CARE INSTRUCTIONS   Keep the injured area  high up (elevated).  Use the injured area as little as possible.  Keep the injured area clean and dry. Follow any directions from your caregiver.  Keep any follow-up or wound check appointments. You might need a tetanus shot now if:  You have no idea when you had the last one.  You have never had a tetanus shot before.  The injured area had dirt in it. Even if you have already removed the splinter, call your caregiver to get a tetanus shot if you need one.  If you need a tetanus shot, and you decide not to get one, there is a rare chance of getting tetanus. Sickness from tetanus can be serious. If you did get a tetanus shot, your arm may swell, get red and warm to the touch at the shot site. This is common and not a problem. SEEK MEDICAL CARE IF:   A splinter has been removed, but you are not better in a day or two.  You develop a temperature.  Signs of infection develop such as:  Redness, swelling or pus around the wound.  Red streaks spreading back from your wound towards your body. Document Released: 06/11/2004 Document Revised: 09/18/2013 Document Reviewed: 05/14/2008 [facility] Patient Information 2015 [place], [place]. This information is not intended to replace advice given to you by your health care provider. Make sure you discuss any questions you have with your health care provider.

## 2014-08-25 NOTE — ED Provider Notes (Signed)
CSN: [id number]     Arrival date & time 08/25/14  1707 History   First MD Initiated Contact with Patient 08/25/14 1759     Chief Complaint  Patient presents with  . Foreign Body in Skin   (Consider location/radiation/quality/duration/timing/severity/associated sxs/prior Treatment) HPI Comments: Patient works for [employer] and while at work climbing a telephone pole one week ago, got a splinter in left middle finger that he has been unable to remove at home. Has recently developed small amount of purulent drainage from wound and increased tenderness. Reports himself to be otherwise healthy. Cannot recall the date of his last tetanus booster.   The history is provided by the patient.    Past Medical History  Diagnosis Date  . Arthritis    History reviewed. No pertinent past surgical history. History reviewed. No pertinent family history. History  Substance Use Topics  . Smoking status: Current Some Day Smoker    Types: Cigarettes  . Smokeless tobacco: Not on file  . Alcohol Use: No    Review of Systems  All other systems reviewed and are negative.   Allergies  Review of patient's allergies indicates no known allergies.  Home Medications   Prior to Admission medications   Medication Sig Start Date End Date Taking? Authorizing Provider  amoxicillin-clavulanate (AUGMENTIN) 875-125 MG per tablet Take 1 tablet by mouth every 12 (twelve) hours. 08/25/14   Ria ClockJennifer Lee H Paeton Studer, PA  ciprofloxacin-dexamethasone (CIPRODEX) otic suspension Place 4 drops into the left ear 2 (two) times daily. 08/31/13   Cathlyn ParsonsAngela M Kabbe, NP   BP 119/81 mmHg  Pulse 79  Temp(Src) 98 F (36.7 C) (Oral)  Resp 12  SpO2 100% Physical Exam  Constitutional: He is oriented to person, place, and time. He appears well-developed and well-nourished. No distress.  HENT:  Head: Normocephalic and atraumatic.  Cardiovascular: Normal rate.   Pulmonary/Chest: Effort normal.  Neurological: He is alert and  oriented to person, place, and time.  Skin: Skin is warm and dry.  Small puncture wound visible at pad of left long finger with black material visible under the skin No erythema or induration  No STS  Nursing note and vitals reviewed.   ED Course  FOREIGN BODY REMOVAL Date/Time: 08/25/2014 6:41 PM Performed by: Lemmie EvensPRESSON, Reda Gettis LEE H Authorized by: Rodolph BongOREY, EVAN S Consent: Verbal consent obtained. Risks and benefits: risks, benefits and alternatives were discussed Consent given by: patient Patient understanding: patient states understanding of the procedure being performed Patient identity confirmed: verbally with patient Time out: Immediately prior to procedure a "time out" was called to verify the correct patient, procedure, equipment, support staff and site/side marked as required. Intake: left long finger. Patient restrained: no Patient cooperative: yes Complexity: simple 3 objects recovered. Objects recovered: three 0.5cm black splinters Post-procedure assessment: foreign body removed Patient tolerance: Patient tolerated the procedure well with no immediate complications Comments: Area cleaned and prepped with betadine and objected removed with tip of 11 blade scalpel.    (including critical care time) Labs Review Labs Reviewed - No data to display  Imaging Review No results found.   MDM   1. Foreign body in finger-infected, initial encounter     Augmentin as directed Wound care at home Monitor closely for additional signs of infection [facility]     Ria ClockJennifer Lee H Rithwik Schmieg, PA 08/25/14 1846  Ria ClockJennifer Lee H Miosha Behe, [place]PA 08/25/14 1900

## 2014-08-25 NOTE — ED Notes (Addendum)
States he got a splinter in his L long finger 1 week ago at work from a telephone pole.  States he tried to get it out and got something out.  He squeezed pus out of it every other day.  Soaked in H2O2.  Finger appears swollen.

## 2014-10-19 ENCOUNTER — Encounter (HOSPITAL_COMMUNITY): Payer: Self-pay | Admitting: Emergency Medicine

## 2014-10-19 ENCOUNTER — Emergency Department (INDEPENDENT_AMBULATORY_CARE_PROVIDER_SITE_OTHER)
Admission: EM | Admit: 2014-10-19 | Discharge: 2014-10-19 | Disposition: A | Discharge status: Home / Self Care | Payer: No Typology Code available for payment source | Source: Home / Self Care | Attending: Family Medicine | Admitting: Family Medicine

## 2014-10-19 DIAGNOSIS — M5090 Cervical disc disorder, unspecified, unspecified cervical region: Secondary | ICD-10-CM | POA: Diagnosis not present

## 2014-10-19 MED ORDER — KETOROLAC TROMETHAMINE 30 MG/ML IJ SOLN
30.0000 mg | Freq: Once | INTRAMUSCULAR | Status: AC
Start: 2014-10-19 — End: 2014-10-19
  Administered 2014-10-19: 30 mg via INTRAMUSCULAR

## 2014-10-19 MED ORDER — METHYLPREDNISOLONE ACETATE 80 MG/ML IJ SUSP
80.0000 mg | Freq: Once | INTRAMUSCULAR | Status: AC
Start: 2014-10-19 — End: 2014-10-19
  Administered 2014-10-19: 80 mg via INTRAMUSCULAR

## 2014-10-19 MED ORDER — METAXALONE 800 MG PO TABS: 800.0000 mg | ORAL_TABLET | Freq: Three times a day (TID) | ORAL | Status: DC

## 2014-10-19 MED ORDER — METHYLPREDNISOLONE ACETATE 80 MG/ML IJ SUSP
INTRAMUSCULAR | Status: AC
  Filled 2014-10-19: qty 1

## 2014-10-19 MED ORDER — KETOROLAC TROMETHAMINE 30 MG/ML IJ SOLN
INTRAMUSCULAR | Status: AC
  Filled 2014-10-19: qty 1

## 2014-10-19 MED ORDER — METHYLPREDNISOLONE 4 MG PO TBPK: ORAL_TABLET | ORAL | Status: DC

## 2014-10-19 NOTE — ED Provider Notes (Signed)
CSN: [id number]     Arrival date & time 10/19/14  1810 History   First MD Initiated Contact with Patient 10/19/14 1936     Chief Complaint  Patient presents with  . Back Pain   (Consider location/radiation/quality/duration/timing/severity/associated sxs/prior Treatment) Patient is a [age] y.o. male presenting with back pain. The history is provided by the patient.  Back Pain Location:  Thoracic spine Quality:  Aching and stabbing Radiates to:  R shoulder Pain severity:  Moderate Onset quality:  Gradual Duration:  1 week Chronicity:  Recurrent Context comment:  H/o pinched n in neck and persistent numbness in right hand since 1998. reinjured 1 week ago with back pain, no lower ext sx, no gi or gu sx. Relieved by:  None tried Worsened by:  Nothing tried Associated symptoms: numbness and weakness     Past Medical History  Diagnosis Date  . Arthritis    History reviewed. No pertinent past surgical history. History reviewed. No pertinent family history. History  Substance Use Topics  . Smoking status: Former Smoker    Types: Cigarettes    Quit date: 10/05/2014  . Smokeless tobacco: Not on file  . Alcohol Use: No    Review of Systems  Musculoskeletal: Positive for back pain. Negative for gait problem.  Neurological: Positive for weakness and numbness.    Allergies  Review of patient's allergies indicates no known allergies.  Home Medications   Prior to Admission medications   Medication Sig Start Date End Date Taking? Authorizing Provider  amoxicillin-clavulanate (AUGMENTIN) 875-125 MG per tablet Take 1 tablet by mouth every 12 (twelve) hours. 08/25/14   Ria ClockJennifer Lee H Presson, PA  ciprofloxacin-dexamethasone (CIPRODEX) otic suspension Place 4 drops into the left ear 2 (two) times daily. 08/31/13   Cathlyn ParsonsAngela M Kabbe, NP  metaxalone (SKELAXIN) 800 MG tablet Take 1 tablet (800 mg total) by mouth 3 (three) times daily. As muscle relaxer 10/19/14   Linna HoffJames D Ashtan Laton, MD  methylPREDNISolone  (MEDROL DOSEPAK) 4 MG TBPK tablet follow package directions, start on sat, take until finished 10/19/14   Linna HoffJames D Caitlen Worth, MD   BP 152/92 mmHg  Pulse 60  Temp(Src) 98.1 F (36.7 C) (Oral)  Resp 16  SpO2 99% Physical Exam  Constitutional: He is oriented to person, place, and time. He appears well-developed and well-nourished.  Musculoskeletal: He exhibits tenderness.       Cervical back: He exhibits decreased range of motion, tenderness, pain and spasm. He exhibits normal pulse.       Back:  Neurological: He is alert and oriented to person, place, and time.  Skin: Skin is warm and dry.  Nursing note and vitals reviewed.   ED Course  Procedures (including critical care time) Labs Review Labs Reviewed - No data to display  Imaging Review No results found.   MDM   1. Cervical neck pain with evidence of disc disease        Linna HoffJames D Jassmin Kemmerer, MD 10/19/14 2004

## 2014-10-19 NOTE — ED Notes (Signed)
Pt states he has a pinched nerve in his back between his shoulder blades.  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  Pt states he has had this problem back in 1998.

## 2014-10-19 NOTE — Discharge Instructions (Signed)
Use medicine as prescribed, see orthopedist as possible for further eval.

## 2014-10-20 ENCOUNTER — Emergency Department (HOSPITAL_COMMUNITY)
Admission: EM | Admit: 2014-10-20 | Discharge: 2014-10-20 | Disposition: A | Discharge status: Home / Self Care | Payer: No Typology Code available for payment source | Attending: Emergency Medicine | Admitting: Emergency Medicine

## 2014-10-20 ENCOUNTER — Encounter (HOSPITAL_COMMUNITY): Payer: Self-pay | Admitting: Emergency Medicine

## 2014-10-20 DIAGNOSIS — Z79899 Other long term (current) drug therapy: Secondary | ICD-10-CM | POA: Diagnosis not present

## 2014-10-20 DIAGNOSIS — M1990 Unspecified osteoarthritis, unspecified site: Secondary | ICD-10-CM | POA: Diagnosis not present

## 2014-10-20 DIAGNOSIS — Z87828 Personal history of other (healed) physical injury and trauma: Secondary | ICD-10-CM | POA: Diagnosis not present

## 2014-10-20 DIAGNOSIS — M546 Pain in thoracic spine: Secondary | ICD-10-CM | POA: Insufficient documentation

## 2014-10-20 DIAGNOSIS — M79601 Pain in right arm: Secondary | ICD-10-CM | POA: Diagnosis present

## 2014-10-20 DIAGNOSIS — Z87891 Personal history of nicotine dependence: Secondary | ICD-10-CM | POA: Insufficient documentation

## 2014-10-20 DIAGNOSIS — M549 Dorsalgia, unspecified: Secondary | ICD-10-CM

## 2014-10-20 MED ORDER — HYDROCODONE-ACETAMINOPHEN 5-325 MG PO TABS: 1.0000 | ORAL_TABLET | Freq: Four times a day (QID) | ORAL | Status: DC | PRN

## 2014-10-20 NOTE — Discharge Instructions (Signed)
Back Pain, Adult Low back pain is very common. About 1 in 5 people have back pain.The cause of low back pain is rarely dangerous. The pain often gets better over time.About half of people with a sudden onset of back pain feel better in just 2 weeks. About 8 in 10 people feel better by 6 weeks.  CAUSES Some common causes of back pain include:  Strain of the muscles or ligaments supporting the spine.  Wear and tear (degeneration) of the spinal discs.  Arthritis.  Direct injury to the back. DIAGNOSIS Most of the time, the direct cause of low back pain is not known.However, back pain can be treated effectively even when the exact cause of the pain is unknown.Answering your caregiver's questions about your overall health and symptoms is one of the most accurate ways to make sure the cause of your pain is not dangerous. If your caregiver needs more information, he or she may order lab work or imaging tests (X-rays or MRIs).However, even if imaging tests show changes in your back, this usually does not require surgery. HOME CARE INSTRUCTIONS For many people, back pain returns.Since low back pain is rarely dangerous, it is often a condition that people can learn to [facility] their own.   Remain active. It is stressful on the back to sit or stand in one place. Do not sit, drive, or stand in one place for more than 30 minutes at a time. Take short walks on level surfaces as soon as pain allows.Try to increase the length of time you walk each day.  Do not stay in bed.Resting more than 1 or 2 days can delay your recovery.  Do not avoid exercise or work.Your body is made to move.It is not dangerous to be active, even though your back may hurt.Your back will likely heal faster if you return to being active before your pain is gone.  Pay attention to your body when you bend and lift. Many people have less discomfortwhen lifting if they bend their knees, keep the load close to their bodies,and  avoid twisting. Often, the most comfortable positions are those that put less stress on your recovering back.  Find a comfortable position to sleep. Use a firm mattress and lie on your side with your knees slightly bent. If you lie on your back, put a pillow under your knees.  Only take over-the-counter or prescription medicines as directed by your caregiver. Over-the-counter medicines to reduce pain and inflammation are often the most helpful.Your caregiver may prescribe muscle relaxant drugs.These medicines help dull your pain so you can more quickly return to your normal activities and healthy exercise.  Put ice on the injured area.  Put ice in a plastic bag.  Place a towel between your skin and the bag.  Leave the ice on for 15-20 minutes, 03-04 times a day for the first 2 to 3 days. After that, ice and heat may be alternated to reduce pain and spasms.  Ask your caregiver about trying back exercises and gentle massage. This may be of some benefit.  Avoid feeling anxious or stressed.Stress increases muscle tension and can worsen back pain.It is important to recognize when you are anxious or stressed and learn ways to manage it.Exercise is a great option. SEEK MEDICAL CARE IF:  You have pain that is not relieved with rest or medicine.  You have pain that does not improve in 1 week.  You have new symptoms.  You are generally not feeling well. SEEK  IMMEDIATE MEDICAL CARE IF:   You have pain that radiates from your back into your legs.  You develop new bowel or bladder control problems.  You have unusual weakness or numbness in your arms or legs.  You develop nausea or vomiting.  You develop abdominal pain.  You feel faint. Document Released: 05/04/2005 Document Revised: 11/03/2011 Document Reviewed: 09/05/2013 [facility] Patient Information 2015 [place], [place]. This information is not intended to replace advice given to you by your health care provider. Make sure you  discuss any questions you have with your health care provider.  Radicular Pain Radicular pain in either the arm or leg is usually from a bulging or herniated disk in the spine. A piece of the herniated disk may press against the nerves as the nerves exit the spine. This causes pain which is felt at the tips of the nerves down the arm or leg. Other causes of radicular pain may include:  Fractures.  Heart disease.  Cancer.  An abnormal and usually degenerative state of the nervous system or nerves (neuropathy). Diagnosis may require CT or MRI scanning to determine the primary cause.  Nerves that start at the neck (nerve roots) may cause radicular pain in the outer shoulder and arm. It can spread down to the thumb and fingers. The symptoms vary depending on which nerve root has been affected. In most cases radicular pain improves with conservative treatment. Neck problems may require physical therapy, a neck collar, or cervical traction. Treatment may take many weeks, and surgery may be considered if the symptoms do not improve.  Conservative treatment is also recommended for sciatica. Sciatica causes pain to radiate from the lower back or buttock area down the leg into the foot. Often there is a history of back problems. Most patients with sciatica are better after 2 to 4 weeks of rest and other supportive care. Short term bed rest can reduce the disk pressure considerably. Sitting, however, is not a good position since this increases the pressure on the disk. You should avoid bending, lifting, and all other activities which make the problem worse. Traction can be used in severe cases. Surgery is usually reserved for patients who do not improve within the first months of treatment. Only take over-the-counter or prescription medicines for pain, discomfort, or fever as directed by your caregiver. Narcotics and muscle relaxants may help by relieving more severe pain and spasm and by providing mild  sedation. Cold or massage can give significant relief. Spinal manipulation is not recommended. It can increase the degree of disc protrusion. Epidural steroid injections are often effective treatment for radicular pain. These injections deliver medicine to the spinal nerve in the space between the protective covering of the spinal cord and back bones (vertebrae). Your caregiver can give you more information about steroid injections. These injections are most effective when given within two weeks of the onset of pain.  You should see your caregiver for follow up care as recommended. A program for neck and back injury rehabilitation with stretching and strengthening exercises is an important part of management.  SEEK IMMEDIATE MEDICAL CARE IF:  You develop increased pain, weakness, or numbness in your arm or leg.  You develop difficulty with bladder or bowel control.  You develop abdominal pain. Document Released: 06/11/2004 Document Revised: 07/27/2011 Document Reviewed: 08/27/2008 [facility] Patient Information 2015 [place], [place]. This information is not intended to replace advice given to you by your health care provider. Make sure you discuss any questions you have with  your health care provider.   Emergency Department Resource Guide 1) Find a Doctor and Pay Out of Pocket Although you won't have to find out who is covered by your insurance plan, it is a good idea to ask around and get recommendations. You will then need to call the office and see if the doctor you have chosen will accept you as a new patient and what types of options they offer for patients who are self-pay. Some doctors offer discounts or will set up payment plans for their patients who do not have insurance, but you will need to ask so you aren't surprised when you get to your appointment.  2) Contact Your Local Health Department Not all health departments have doctors that can see patients for sick visits, but many do, so it  is worth a call to see if yours does. If you don't know where your local health department is, you can check in your phone book. The CDC also has a tool to help you locate your state's health department, and many state websites also have listings of all of their local health departments.  3) Find a Walk-in Clinic If your illness is not likely to be very severe or complicated, you may want to try a walk in clinic. These are popping up all over the country in pharmacies, drugstores, and shopping centers. They're usually staffed by nurse practitioners or physician assistants that have been trained to treat common illnesses and complaints. They're usually fairly quick and inexpensive. However, if you have serious medical issues or chronic medical problems, these are probably not your best option.  No Primary Care Doctor: - Call Health Connect at  [phone] - they can help you locate a primary care doctor that  accepts your insurance, provides certain services, etc. - Physician Referral Service- [phone]  Chronic Pain Problems: Organization         Address  Phone   Notes  [facility]  [phone] Patients need to be referred by their primary care doctor.   Medication Assistance: Organization         Address  Phone   Notes  [facility] Medication [facility] [address] [place] [place], [place] [address] [phone] --Must be a resident of [facility] -- Must have NO insurance coverage whatsoever (no Medicaid/ Medicare, etc.) -- The pt. MUST have a primary care doctor that directs their care regularly and follows them in the community   [facility]ist  [phone]   [employer]  [phone]    Agencies that provide inexpensive medical care: Organization         Address  Phone   Notes  [facility]  [phone]   [facility]    [phone]   [facility] [address] [place], [place] [address] [phone]   [facility] [address] [place] [place] [phone]   Planned Parenthood    [phone]   [facility]    [phone]   [facility]  [address], [place] Phone:  [phone], Fax:  [phone] Hours of Operation:  9 am - 6 pm, M-F.  Also accepts Medicaid/Medicare and self-pay.  [facility]  [address], [place] Phone: [phone], Fax: [phone]. Hours of Operation:  8:30 am - 5:30 pm, M-F.  Also accepts Medicaid  and self-pay.  [facility] [place] [address], [place] Phone: [phone]   [facility] [address] Natasha Bence [place], [place] [phone], Ext. [phone] Mondays & Thursdays: 7-9 AM.  First 15 patients are seen on a first come, first serve basis.    Medicaid-accepting [facility] Providers:  Organization         Address  Phone   Notes  [facility] [address], [place] [phone] Also accepts self-pay patients.  [facility] [address] Laurell Josephs [place], [place]  [phone]   [facility] [address], [place] [phone]   [facility] [address] [place] [phone]   Renaye Rakers [address], [place]   [phone] Only accepts [place] Access [place] patients after they have their name applied to their card.   Self-Pay (no insurance) in [facility]:  Organization         Address  Phone   Notes  Sickle Cell Patients, [facility] [address] [place] [address] [place], [place] [phone]   [facility] [address] [place], [place] [phone]   [facility] [place]  [address], [place] [phone]   [facility]/Dr. Osei-Bonsu  [address], [place] or  [address], [place] [phone] Phone number for both [place] and [place] locations is the same.  [facility] [address] [place] [phone]   [facility] [address], [place] or [address] [phone]   [facility] [address], [place] [phone], phone; [phone], fax Sees patients 1st and 3rd Saturday of every month.  Must not qualify for public or private insurance (i.e. Medicaid, Medicare, [place] Health Choice, Veterans' Benefits)  Household income should be no more than 200% of the poverty level The clinic cannot treat you if you are pregnant or think you are pregnant  Sexually transmitted diseases are not treated at the clinic.    Dental Care: Organization         Address  Phone  Notes  [facility] of [facility] [address] [place], [place] [phone] Accepts children up to age 52 who are enrolled in [place] or [place] Health Choice; pregnant women with a Medicaid card; and children who have applied for Medicaid or [place] Health Choice, but were declined, whose parents can pay a reduced fee at time of service.  [facility] Department of [facility]  [address], [place] [phone] Accepts children up to age 14 who are enrolled in [place] or [place] Health Choice; pregnant women with a Medicaid card; and children who have applied for Medicaid or [place] Health Choice, but were declined, whose parents can pay a reduced fee at time of service.  [facility]  [address] [place], [place] [phone] Patients are seen by appointment only. Walk-ins are not accepted. [facility] will see patients 19 years of age and older. Monday - Tuesday (8am-5pm) Most Wednesdays (8:30-5pm) $30 per visit, cash only  [facility] Dental Access PRO[facility]  [address], [facility] [phone] Patients are seen by appointment only. Walk-ins are not accepted. [facility] will see patients  918 years of age and older. One Wednesday Evening (Monthly: Volunteer Based).  $30 per visit, cash only  [employer] [facility]  [phone] for adults; Children under age 504, call Graduate Pediatric Dentistry at [phone]. Children aged 554-14, please call [phone] to request a pediatric application.  Dental services are provided in all areas of dental care including fillings, crowns and bridges, complete and partial dentures, implants, gum treatment, root canals, and extractions. Preventive care is also provided. Treatment is provided to both adults and children. Patients are selected via a lottery and there is often a waiting list.   [facility] [address], [place]  [phone] [email or web address]   [facility] [address], [place], [place] [phone], Ext. [phone] Second and Fourth Thursday of each month, opens at 6:30 AM; Clinic ends at 9 AM.  Patients are seen on a first-come first-served basis, and a limited number are seen during each clinic.   [facility]  [address], [place], [place] [phone]) [phone]   Eligibility Requirements You must have lived in [place], [place], or [place] counties for at least the last three months.   You cannot be eligible for state or federal sponsored [place]healthcare insurance, including CIG[employer]eterans Administration, [place]icaid, or [employer]icare.   You generally cannot be eligible for healthcare insurance through your employer.    How to apply: Eligibility screenings are held every Tuesday and Wednesday afternoon from 1:00 pm until 4:00 pm. You do not need an appointment for the interview!  [facility] [address], [place], [place] [phone]   [facility]  [phone]   [facility]  [phone]   [facility]  [phone]    Behavioral Health Resources in the Community: Intensive Outpatient Programs Organization         Address  Phone  Notes  [facility] [address], [place], [place] [phone]   [facility] [address], [place], [place] [phone]   [facility] [address], [place], [place]  [phone]   [facility] [address],  [place], [place] [phone] or [phone]   Substance Abuse Resources Organization         Address  Phone  Notes  [facility]  [phone]   [facility]  [phone]   [facility]  [phone]   [facility]  [phone]   Residential & Outpatient Substance Abuse Program  [phone]   Psychological Services Organization         Address  Phone  Notes  [facility]  [phone]- [phone]   [facility]  [phone]- [phone]   [facility] [address], [place] [phone] or [phone]    Mobile Crisis Teams Organization         Address  Phone  Notes  [facility], Mobile Crisis Care Unit  [phone]   [facility]  [address] [place], [place] [phone]   Doristine LocksSharon DeEsch [address] [place] [phone]    Self-Help/Support Groups Organization         Address  Phone             Notes  [facility] [place] - variety of support groups  [phone] Call for more information  Narcotics Anonymous (NA), [facility] [address], [place]  2 meetings at this location   Residential Treatment Programs Organization         Address  Phone  Notes  [facility] [address],    [place]  [phone]   [facility]  [address], [place] [address], [place], [place] [phone]   [facility] [address] [place], [place] [phone]  Admissions: 8am-3pm M-F  [facility] [address] [place] [address]    [place], [place] [phone]   [facility] [address] [place], [place], [place] [phone]   [facility] [address]  [place], [place] [phone]   [facility] - Intensive Outpatient [address]urell Josephs [address], [place], [place] [phone]   [facility] ([facility] [address] [place]  [place], [place] [phone] or [phone]   [facility] ([facility]) [address], [place], [place] [phone] Accepts Medicaid  [facility] [place] [address],  [place] [phone] Substance Abuse/Addiction Treatment   [facility] Organization         Address  Phone  Notes  [facility]  [phone]   Angie Fava, PhD [address] Ervin Knack [place], [place]   [phone] or [phone]   [facility]   [address] [place], [place] [phone]   [facility] [address], [place], [place] [phone] Insurance/Medicaid/sponsorship through [facility] and Families [address]                                    [place], [place] [phone] Therapy/tele-psych/case  [facility] [address], [place] [phone]    Dr. Lolly Mustache  [phone]   [facility] [place]  [employer] [facility] Dept. 1) [address] [place] 2) [address] [place] 3)  [address], [place] [phone]  [phone]   [facility] Child Abuse Hotline [phone] or [phone] (After Hours)

## 2014-10-20 NOTE — ED Notes (Signed)
Patient here with complaint of back pain radiating to right arm. [facility] Was given medications with no relief of discomfort. States previous history of pinched nerve with similar presentation.
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
## 2014-11-04 ENCOUNTER — Emergency Department (HOSPITAL_COMMUNITY)
Admission: EM | Admit: 2014-11-04 | Discharge: 2014-11-04 | Disposition: A | Discharge status: Home / Self Care | Payer: No Typology Code available for payment source | Attending: Emergency Medicine | Admitting: Emergency Medicine

## 2014-11-04 ENCOUNTER — Encounter (HOSPITAL_COMMUNITY): Payer: Self-pay

## 2014-11-04 DIAGNOSIS — M1990 Unspecified osteoarthritis, unspecified site: Secondary | ICD-10-CM | POA: Diagnosis not present

## 2014-11-04 DIAGNOSIS — R1013 Epigastric pain: Secondary | ICD-10-CM | POA: Diagnosis present

## 2014-11-04 DIAGNOSIS — Z87891 Personal history of nicotine dependence: Secondary | ICD-10-CM | POA: Insufficient documentation

## 2014-11-04 DIAGNOSIS — K2970 Gastritis, unspecified, without bleeding: Secondary | ICD-10-CM | POA: Diagnosis not present

## 2014-11-04 DIAGNOSIS — M542 Cervicalgia: Secondary | ICD-10-CM | POA: Insufficient documentation

## 2014-11-04 DIAGNOSIS — Z79899 Other long term (current) drug therapy: Secondary | ICD-10-CM | POA: Insufficient documentation

## 2014-11-04 LAB — COMPREHENSIVE METABOLIC PANEL
ALBUMIN: 3.9 g/dL (ref 3.5–5.0)
ALT: 31 U/L (ref 17–63)
AST: 30 U/L (ref 15–41)
Alkaline Phosphatase: 52 U/L (ref 38–126)
Anion gap: 7 (ref 5–15)
BILIRUBIN TOTAL: 0.7 mg/dL (ref 0.3–1.2)
BUN: 16 mg/dL (ref 6–20)
CO2: 21 mmol/L — ABNORMAL LOW (ref 22–32)
Calcium: 9 mg/dL (ref 8.9–10.3)
Chloride: 105 mmol/L (ref 101–111)
Creatinine, Ser: 0.94 mg/dL (ref 0.61–1.24)
GFR calc Af Amer: >60 mL/min (ref >60)
Glucose, Bld: 108 mg/dL — ABNORMAL HIGH (ref 65–99)
POTASSIUM: 4.3 mmol/L (ref 3.5–5.1)
SODIUM: 133 mmol/L — AB (ref 135–145)
Total Protein: 7.1 g/dL (ref 6.5–8.1)

## 2014-11-04 LAB — CBC WITH DIFFERENTIAL/PLATELET
BASOS PCT: 0 % (ref 0–1)
Basophils Absolute: 0 10*3/uL (ref 0.0–0.1)
Eosinophils Absolute: 0.1 10*3/uL (ref 0.0–0.7)
Eosinophils Relative: 1 % (ref 0–5)
HCT: 40.2 % (ref 39.0–52.0)
Hemoglobin: 13.7 g/dL (ref 13.0–17.0)
Lymphocytes Relative: 19 % (ref 12–46)
Lymphs Abs: 1.5 10*3/uL (ref 0.7–4.0)
MCH: 30.6 pg (ref 26.0–34.0)
MCHC: 34.1 g/dL (ref 30.0–36.0)
MCV: 89.7 fL (ref 78.0–100.0)
Monocytes Absolute: 0.5 10*3/uL (ref 0.1–1.0)
Monocytes Relative: 7 % (ref 3–12)
NEUTROS ABS: 5.5 10*3/uL (ref 1.7–7.7)
Neutrophils Relative %: 73 % (ref 43–77)
PLATELETS: 172 10*3/uL (ref 150–400)
RBC: 4.48 MIL/uL (ref 4.22–5.81)
RDW: 13 % (ref 11.5–15.5)
WBC: 7.6 10*3/uL (ref 4.0–10.5)

## 2014-11-04 LAB — LIPASE, BLOOD: Lipase: 32 U/L (ref 22–51)

## 2014-11-04 MED ORDER — HYDROCODONE-ACETAMINOPHEN 5-325 MG PO TABS: 1.0000 | ORAL_TABLET | ORAL | Status: DC | PRN

## 2014-11-04 MED ORDER — HYDROCODONE-ACETAMINOPHEN 5-325 MG PO TABS
1.0000 | ORAL_TABLET | Freq: Once | ORAL | Status: AC
  Administered 2014-11-04: 1 via ORAL
  Filled 2014-11-04: qty 1

## 2014-11-04 MED ORDER — ONDANSETRON 4 MG PO TBDP
4.0000 mg | ORAL_TABLET | Freq: Once | ORAL | Status: AC
  Administered 2014-11-04: 4 mg via ORAL
  Filled 2014-11-04: qty 1

## 2014-11-04 MED ORDER — PANTOPRAZOLE SODIUM 40 MG PO TBEC
40.0000 mg | DELAYED_RELEASE_TABLET | Freq: Once | ORAL | Status: AC
  Administered 2014-11-04: 40 mg via ORAL
  Filled 2014-11-04: qty 1

## 2014-11-04 MED ORDER — OMEPRAZOLE 20 MG PO CPDR: 20.0000 mg | DELAYED_RELEASE_CAPSULE | Freq: Two times a day (BID) | ORAL | Status: DC

## 2014-11-04 NOTE — ED Provider Notes (Signed)
CSN: [id number]     Arrival date & time 11/04/14  5621 History   First MD Initiated Contact with Patient 11/04/14 [phone]     Chief Complaint  Patient presents with  . Abdominal Pain      HPI  She presents for evaluation of abdominal pain. Epigastric in nature. Feels like a burning. States it feels "like a use to have to take this medicine". He hasn't developed Prilosec. I grossly gastritis year ago and was on that for 2 months. Has not had a recurrence of symptoms. Has had a "pinched nerve" with some neck pain has been intermittently on Medrol and naproxen over the last several weeks. No vomiting. States he feels full very easily. No blood in his stools or black stools. Weak or lightheaded. States he is here because of the pain kept about the last 2 nights.  Past Medical History  Diagnosis Date  . Arthritis    History reviewed. No pertinent past surgical history. No family history on file. History  Substance Use Topics  . Smoking status: Former Smoker    Types: Cigarettes    Quit date: 10/05/2014  . Smokeless tobacco: Not on file  . Alcohol Use: No    Review of Systems  Constitutional: Negative for fever, chills, diaphoresis, appetite change and fatigue.  HENT: Negative for mouth sores, sore throat and trouble swallowing.   Eyes: Negative for visual disturbance.  Respiratory: Negative for cough, chest tightness, shortness of breath and wheezing.   Cardiovascular: Negative for chest pain.  Gastrointestinal: Positive for abdominal pain. Negative for nausea, vomiting, diarrhea and abdominal distention.  Endocrine: Negative for polydipsia, polyphagia and polyuria.  Genitourinary: Negative for dysuria, frequency and hematuria.  Musculoskeletal: Positive for neck pain. Negative for gait problem.  Skin: Negative for color change, pallor and rash.  Neurological: Negative for dizziness, syncope, light-headedness and headaches.  Hematological: Does not bruise/bleed easily.    Psychiatric/Behavioral: Negative for behavioral problems and confusion.      Allergies  Review of patient's allergies indicates no known allergies.  Home Medications   Prior to Admission medications   Medication Sig Start Date End Date Taking? Authorizing Provider  ibuprofen (ADVIL,MOTRIN) 800 MG tablet Take 800 mg by mouth every 8 (eight) hours as needed for moderate pain.   Yes Historical Provider, MD  metaxalone (SKELAXIN) 800 MG tablet Take 1 tablet (800 mg total) by mouth 3 (three) times daily. As muscle relaxer 10/19/14  Yes Linna Hoff, MD  amoxicillin-clavulanate (AUGMENTIN) 875-125 MG per tablet Take 1 tablet by mouth every 12 (twelve) hours. Patient not taking: Reported on 11/04/2014 08/25/14   Ria Clock, PA  ciprofloxacin-dexamethasone [facility]) otic suspension Place 4 drops into the left ear 2 (two) times daily. Patient not taking: Reported on 11/04/2014 08/31/13   Cathlyn Parsons, NP  HYDROcodone-acetaminophen (NORCO/VICODIN) 5-325 MG per tablet Take 1 tablet by mouth every 4 (four) hours as needed. 11/04/14   Rolland Porter, MD  methylPREDNISolone (MEDROL DOSEPAK) 4 MG TBPK tablet follow package directions, start on sat, take until finished Patient not taking: Reported on 11/04/2014 10/19/14   Linna Hoff, MD  omeprazole (PRILOSEC) 20 MG capsule Take 1 capsule (20 mg total) by mouth 2 (two) times daily. 11/04/14   Rolland Porter, MD   BP 148/92 mmHg  Pulse 62  Temp(Src) 97.8 F (36.6 C) (Oral)  Resp 16  Ht  (1.753 m)  Wt 155 lb (70.308 kg)  BMI 22.88 kg/m2  SpO2 97% Physical Exam  Constitutional: He is oriented to person, place, and time. He appears well-developed and well-nourished. No distress.  HENT:  Head: Normocephalic.  Conjunctiva not pale. No scleral icterus.  Eyes: Conjunctivae are normal. Pupils are equal, round, and reactive to light. No scleral icterus.  Neck: Normal range of motion. Neck supple. No thyromegaly present.  Cardiovascular: Normal rate  and regular rhythm.  Exam reveals no gallop and no friction rub.   No murmur heard. Pulmonary/Chest: Effort normal and breath sounds normal. No respiratory distress. He has no wheezes. He has no rales.  Abdominal: Soft. Bowel sounds are normal. He exhibits no distension. There is no tenderness. There is no rebound.    Musculoskeletal: Normal range of motion.  Neurological: He is alert and oriented to person, place, and time.  Skin: Skin is warm and dry. No rash noted.  Psychiatric: He has a normal mood and affect. His behavior is normal.    ED Course  Procedures (including critical care time) Labs Review Labs Reviewed  COMPREHENSIVE METABOLIC PANEL - Abnormal; Notable for the following:    Sodium 133 (*)    CO2 21 (*)    Glucose, Bld 108 (*)    All other components within normal limits  CBC WITH DIFFERENTIAL/PLATELET  LIPASE, BLOOD    Imaging Review No results found.   EKG Interpretation None      MDM   Final diagnoses:  Epigastric pain  Gastritis    Assuring labs for Unasyn avoid alcohol, tobacco, type laboratories, caffeine. Prilosec twice a day until symptoms improve then daily to finish a minimum of 2 weeks. Primary care follow-up. Has an MRI scheduled of his neck from an orthopedic appointment that is scheduled for Tuesday.      Rolland Porter, MD 11/04/14 1013

## 2014-11-04 NOTE — ED Notes (Signed)
Pt states that he has stomach acid on his stomach, back pain from a pinched nerve which is also making his right arm hurt.

## 2014-11-04 NOTE — Discharge Instructions (Signed)

## 2014-11-04 NOTE — ED Notes (Signed)
Pt states he feels much better. Denies abdominal pain at the time. Vital signs stable. NAD

## 2015-10-11 ENCOUNTER — Ambulatory Visit (INDEPENDENT_AMBULATORY_CARE_PROVIDER_SITE_OTHER): Payer: 59 | Admitting: Urgent Care

## 2015-10-11 ENCOUNTER — Ambulatory Visit (HOSPITAL_COMMUNITY)
Admission: EM | Admit: 2015-10-11 | Discharge: 2015-10-11 | Disposition: A | Discharge status: Home / Self Care | Payer: No Typology Code available for payment source

## 2015-10-11 VITALS — BP 118/80 | HR 68 | Temp 98.1°F | Resp 16 | Ht 67.25 in | Wt 163.6 lb

## 2015-10-11 DIAGNOSIS — S30862A Insect bite (nonvenomous) of penis, initial encounter: Secondary | ICD-10-CM

## 2015-10-11 DIAGNOSIS — L299 Pruritus, unspecified: Secondary | ICD-10-CM | POA: Diagnosis not present

## 2015-10-11 DIAGNOSIS — W57XXXA Bitten or stung by nonvenomous insect and other nonvenomous arthropods, initial encounter: Secondary | ICD-10-CM | POA: Diagnosis not present

## 2015-10-11 DIAGNOSIS — L259 Unspecified contact dermatitis, unspecified cause: Secondary | ICD-10-CM

## 2015-10-11 DIAGNOSIS — R599 Enlarged lymph nodes, unspecified: Secondary | ICD-10-CM

## 2015-10-11 DIAGNOSIS — R590 Localized enlarged lymph nodes: Secondary | ICD-10-CM

## 2015-10-11 DIAGNOSIS — S30861A Insect bite (nonvenomous) of abdominal wall, initial encounter: Secondary | ICD-10-CM

## 2015-10-11 LAB — POCT CBC
Granulocyte percent: 61 %G (ref 37–80)
HCT, POC: 40.1 % — AB (ref 43.5–53.7)
Hemoglobin: 14.1 g/dL (ref 14.1–18.1)
Lymph, poc: 2 (ref 0.6–3.4)
MCH, POC: 31.7 pg — AB (ref 27–31.2)
MCHC: 35 g/dL (ref 31.8–35.4)
MCV: 90.4 fL (ref 80–97)
MID (CBC): 0.6 (ref 0–0.9)
MPV: 8.4 fL (ref 0–99.8)
PLATELET COUNT, POC: 165 10*3/uL (ref 142–424)
POC Granulocyte: 4.1 (ref 2–6.9)
POC LYMPH %: 29.7 % (ref 10–50)
POC MID %: 9.3 %M (ref 0–12)
RBC: 4.44 M/uL — AB (ref 4.69–6.13)
RDW, POC: 13.3 %
WBC: 6.7 10*3/uL (ref 4.6–10.2)

## 2015-10-11 LAB — POCT URINALYSIS DIP (MANUAL ENTRY)
Bilirubin, UA: NEGATIVE
Blood, UA: NEGATIVE
Glucose, UA: NEGATIVE
Ketones, POC UA: NEGATIVE
Leukocytes, UA: NEGATIVE
NITRITE UA: NEGATIVE
Protein Ur, POC: NEGATIVE
Spec Grav, UA: 1.025
UROBILINOGEN UA: 1
pH, UA: 6

## 2015-10-11 LAB — POC MICROSCOPIC URINALYSIS (UMFC): Mucus: ABSENT

## 2015-10-11 MED ORDER — CETIRIZINE HCL 10 MG PO TABS: 10.0000 mg | ORAL_TABLET | Freq: Every day | ORAL | Status: DC

## 2015-10-11 MED ORDER — NAPROXEN SODIUM 550 MG PO TABS
550.0000 mg | ORAL_TABLET | Freq: Two times a day (BID) | ORAL | Status: DC
Start: 2015-10-11 — End: 2017-10-15

## 2015-10-11 MED ORDER — DOXYCYCLINE MONOHYDRATE 100 MG PO CAPS: 100.0000 mg | ORAL_CAPSULE | Freq: Two times a day (BID) | ORAL | Status: DC

## 2015-10-11 NOTE — Patient Instructions (Addendum)
Use sun block of at least 30-50 spf while taking doxycycline.   Tick Bite Information Ticks are insects that attach themselves to the skin and draw blood for food. There are various types of ticks. Common types include wood ticks and deer ticks. Most ticks live in shrubs and grassy areas. Ticks can climb onto your body when you make contact with leaves or grass where the tick is waiting. The most common places on the body for ticks to attach themselves are the scalp, neck, armpits, waist, and groin. Most tick bites are harmless, but sometimes ticks carry germs that cause diseases. These germs can be spread to a person during the tick's feeding process. The chance of a disease spreading through a tick bite depends on:   The type of tick.  Time of year.   How long the tick is attached.   Geographic location.  HOW CAN YOU PREVENT TICK BITES? Take these steps to help prevent tick bites when you are outdoors:  Wear protective clothing. Long sleeves and long pants are best.   Wear white clothes so you can see ticks more easily.  Tuck your pant legs into your socks.   If walking on a trail, stay in the middle of the trail to avoid brushing against bushes.  Avoid walking through areas with long grass.  Put insect repellent on all exposed skin and along boot tops, pant legs, and sleeve cuffs.   Check clothing, hair, and skin repeatedly and before going inside.   Brush off any ticks that are not attached.  Take a shower or bath as soon as possible after being outdoors.  WHAT IS THE PROPER WAY TO REMOVE A TICK? Ticks should be removed as soon as possible to help prevent diseases caused by tick bites. 1. If latex gloves are available, put them on before trying to remove a tick.  2. Using fine-point tweezers, grasp the tick as close to the skin as possible. You may also use curved forceps or a tick removal tool. Grasp the tick as close to its head as possible. Avoid grasping the  tick on its body. 3. Pull gently with steady upward pressure until the tick lets go. Do not twist the tick or jerk it suddenly. This may break off the tick's head or mouth parts. 4. Do not squeeze or crush the tick's body. This could force disease-carrying fluids from the tick into your body.  5. After the tick is removed, wash the bite area and your hands with soap and water or other disinfectant such as alcohol. 6. Apply a small amount of antiseptic cream or ointment to the bite site.  7. Wash and disinfect any instruments that were used.  Do not try to remove a tick by applying a hot match, petroleum jelly, or fingernail polish to the tick. These methods do not work and may increase the chances of disease being spread from the tick bite.  WHEN SHOULD YOU SEEK MEDICAL CARE? Contact your health care provider if you are unable to remove a tick from your skin or if a part of the tick breaks off and is stuck in the skin.  After a tick bite, you need to be aware of signs and symptoms that could be related to diseases spread by ticks. Contact your health care provider if you develop any of the following in the days or weeks after the tick bite:  Unexplained fever.  Rash. A circular rash that appears days or weeks after the  tick bite may indicate the possibility of Lyme disease. The rash may resemble a target with a bull's-eye and may occur at a different part of your body than the tick bite.  Redness and swelling in the area of the tick bite.   Tender, swollen lymph glands.   Diarrhea.   Weight loss.   Cough.   Fatigue.   Muscle, joint, or bone pain.   Abdominal pain.   Headache.   Lethargy or a change in your level of consciousness.  Difficulty walking or moving your legs.   Numbness in the legs.   Paralysis.  Shortness of breath.   Confusion.   Repeated vomiting.    This information is not intended to replace advice given to you by your health care  provider. Make sure you discuss any questions you have with your health care provider.   Document Released: 05/01/2000 Document Revised: 05/25/2014 Document Reviewed: 10/12/2012 Elsevier Interactive Patient Education 2016 [employer].     IF you received an x-ray today, you will receive an invoice from [facility]. Please contact [facility] at [phone] with questions or concerns regarding your invoice.   IF you received labwork today, you will receive an invoice from [employer]. Please contact [facility] at [phone] with questions or concerns regarding your invoice.   Our billing staff will not be able to assist you with questions regarding bills from these companies.  You will be contacted with the lab results as soon as they are available. The fastest way to get your results is to activate your My Chart account. Instructions are located on the last page of this paperwork. If you have not heard from [place] regarding the results in 2 weeks, please contact this office.

## 2015-10-11 NOTE — Progress Notes (Signed)
MRN: [id number] DOB: 06/11/1960  Subjective:   Brandon BienenstockRonald E Curtis is a 55 y.o. male presenting for chief complaint of tick bite on private  Reports 2 day history of tick bite on penis of his head. Patient noticed progressive itching and worsening inguinal lymph node pain. Eventually patient examined the penis area and noticed a tick. He removed the tick himself with slight bleeding afterwards. He has not used any medications. Denies fever, red eyes, cough, chest pain, n/v, abdominal pain, penile discharge, testicular pain, hematuria, dysuria, rash. Denies smoking cigarettes. Drinks ~2 alcoholic beverages per week.   Brandon Curtis is not currently taking any medications. Also has No Known Allergies.  Brandon Curtis  has a past medical history of Arthritis. Also  has no past surgical history on file.  Denies family history of heart disease, cancer.  Objective:   Vitals: BP 118/80 mmHg  Pulse 68  Temp(Src) 98.1 F (36.7 C) (Oral)  Resp 16  Ht 5' 7.25" (1.708 m)  Wt 163 lb 9.6 oz (74.208 kg)  BMI 25.44 kg/m2  SpO2 97%  Physical Exam  Constitutional: He is oriented to person, place, and time. He appears well-developed and well-nourished.  HENT:  Mouth/Throat: Oropharynx is clear and moist.  Eyes: Conjunctivae are normal.  Neck: Normal range of motion. Neck supple.  Cardiovascular: Normal rate, regular rhythm and intact distal pulses.  Exam reveals no gallop and no friction rub.   No murmur heard. Pulmonary/Chest: No respiratory distress. He has no wheezes. He has no rales.  Abdominal: Soft. Bowel sounds are normal. He exhibits no distension and no mass. There is no tenderness.  Genitourinary: Right testis shows no mass, no swelling and no tenderness. Right testis is descended. Left testis shows no mass, no swelling and no tenderness. Left testis is descended. Circumcised. Penile erythema (slight over abrasion) present. No phimosis, paraphimosis, hypospadias or penile tenderness. No discharge found.       Lymphadenopathy:    He has no cervical adenopathy. Inguinal adenopathy noted on the right and left side.  Neurological: He is alert and oriented to person, place, and time.  Skin: Skin is warm and dry.   Results for orders placed or performed in visit on 10/11/15 (from the past 24 hour(s))  POCT urinalysis dipstick     Status: None   Collection Time: 10/11/15  5:50 PM  Result Value Ref Range   Color, UA yellow yellow   Clarity, UA clear clear   Glucose, UA negative negative   Bilirubin, UA negative negative   Ketones, POC UA negative negative   Spec Grav, UA 1.025    Blood, UA negative negative   pH, UA 6.0    Protein Ur, POC negative negative   Urobilinogen, UA 1.0    Nitrite, UA Negative Negative   Leukocytes, UA Negative Negative  POCT CBC     Status: Abnormal   Collection Time: 10/11/15  5:52 PM  Result Value Ref Range   WBC 6.7 4.6 - 10.2 K/uL   Lymph, poc 2.0 0.6 - 3.4   POC LYMPH PERCENT 29.7 10 - 50 %L   MID (cbc) 0.6 0 - 0.9   POC MID % 9.3 0 - 12 %M   POC Granulocyte 4.1 2 - 6.9   Granulocyte percent 61.0 37 - 80 %G   RBC 4.44 (A) 4.69 - 6.13 M/uL   Hemoglobin 14.1 14.1 - 18.1 g/dL   HCT, POC 40.940.1 (A) 81.143.5 - 53.7 %   MCV 90.4 80 - 97  fL   MCH, POC 31.7 (A) 27 - 31.2 pg   MCHC 35.0 31.8 - 35.4 g/dL   RDW, POC 21.3 %   Platelet Count, POC 165 142 - 424 K/uL   MPV 8.4 0 - 99.8 fL   Assessment and Plan :   1. Tick bite of groin, initial encounter 2. Contact dermatitis 3. Itching 4. Inguinal lymphadenopathy - Due to prominent inguinal lymphadenopathy with start patient on doxycycline. Use Zyrtec and Anaprox concurrently as well. RTC in 1 week if no improvement.  Wallis Bamberg, PA-C [facility] [phone] 10/11/2015 5:04 PM

## 2015-10-12 LAB — BASIC METABOLIC PANEL
BUN: 15 mg/dL (ref 7–25)
CO2: 27 mmol/L (ref 20–31)
CREATININE: 1.02 mg/dL (ref 0.70–1.33)
Calcium: 9.8 mg/dL (ref 8.6–10.3)
Chloride: 103 mmol/L (ref 98–110)
Glucose, Bld: 86 mg/dL (ref 65–99)
POTASSIUM: 4.8 mmol/L (ref 3.5–5.3)
SODIUM: 139 mmol/L (ref 135–146)

## 2015-10-15 ENCOUNTER — Encounter: Payer: Self-pay | Admitting: Urgent Care

## 2015-10-16 LAB — ROCKY MTN SPOTTED FVR ABS PNL(IGG+IGM)
RMSF IGG: NOT DETECTED
RMSF IgM: NOT DETECTED

## 2015-10-29 ENCOUNTER — Ambulatory Visit (INDEPENDENT_AMBULATORY_CARE_PROVIDER_SITE_OTHER): Payer: 59 | Admitting: Urgent Care

## 2015-10-29 VITALS — BP 120/80 | HR 61 | Temp 97.5°F | Resp 16 | Ht 69.0 in | Wt 166.0 lb

## 2015-10-29 DIAGNOSIS — Z711 Person with feared health complaint in whom no diagnosis is made: Secondary | ICD-10-CM

## 2015-10-29 DIAGNOSIS — L293 Anogenital pruritus, unspecified: Secondary | ICD-10-CM | POA: Diagnosis not present

## 2015-10-29 DIAGNOSIS — K148 Other diseases of tongue: Secondary | ICD-10-CM

## 2015-10-29 DIAGNOSIS — W57XXXA Bitten or stung by nonvenomous insect and other nonvenomous arthropods, initial encounter: Secondary | ICD-10-CM

## 2015-10-29 MED ORDER — CHLORHEXIDINE GLUCONATE 0.12 % MT SOLN: 15.0000 mL | Freq: Two times a day (BID) | OROMUCOSAL | Status: DC

## 2015-10-29 NOTE — Patient Instructions (Addendum)
Please come back in 1 month to recheck for any changes under your tongue. In the meantime, use chlorhexidine mouth solution twice daily as needed.    For the itching, continue to use Zyrtec. There is no need to use antibiotics at this point. We can recheck this in 1 month. If you start to get pain, penile discharge, painful urination then please come back to our clinic for appropriate testing and treatment.

## 2015-10-29 NOTE — Progress Notes (Signed)
    MRN: [id number] DOB: 10/13/1960  Subjective:   Brandon BienenstockRonald E Curtis is a 55 y.o. male presenting for chief complaint of Follow-up and mouth issues  Tick Bite - Reports that he completed course of doxycycline to cover for infection following a tick bite to the head of the penis. He had also had significant inguinal lymphadenopathy. This has all improved. However, patient is worried that he developed itching of the same area yesterday. Denies fever, penile discharge, hematuria, dysuria, genital rash, testicular pain or swelling.   Mouth Issue - Reports that he burned his tongue 4 days ago while eating hot soup. Has some tongue swelling under his tongue, has a mild burning sensation. Denies bleeding, taste changes, tooth pain, weight loss. Patient is an intermittent smoker, per patient smokes very occasionally.  Brandon Curtis has a current medication list which includes the following prescription(s): cetirizine, doxycycline, and naproxen sodium. Also has No Known Allergies.  Brandon Curtis  has a past medical history of Arthritis. Also  has no past surgical history on file.  Objective:   Vitals: BP 120/80 mmHg  Pulse 61  Temp(Src) 97.5 F (36.4 C) (Oral)  Resp 16  Ht 5\' 9"  (1.753 m)  Wt 166 lb (75.297 kg)  BMI 24.50 kg/m2  SpO2 99%  Physical Exam  Constitutional: He is oriented to person, place, and time. He appears well-developed and well-nourished.  HENT:  Mouth/Throat: Oropharynx is clear and moist.  Sublingual papula and submandibular glands are somewhat prominent. No tenderness, discharge, erythema. Mucous membranes moist.  Cardiovascular: Normal rate.   Pulmonary/Chest: Effort normal.  Genitourinary: Right testis shows no mass, no swelling and no tenderness. Left testis shows no mass, no swelling and no tenderness. Circumcised. No penile erythema or penile tenderness. No discharge found.     Lymphadenopathy: No inguinal adenopathy noted on the right or left side.  Neurological: He is alert  and oriented to person, place, and time.  Skin: Skin is warm and dry.   Assessment and Plan :   1. Tongue lesion - Will monitor and recheck in 1 month. In the meantime, use chlorhexidine as needed.  2. Tick bite 3. Itching of penis 4. Feared condition not demonstrated - Patient was insisting on more antibiotics for his tick bite. I counseled against this and reassured patient. He will f/u in 1 month for recheck if this problem persists. He declined further testing at this time.  Wallis BambergMario Sriansh Farra, PA-C [facility] [phone] 10/29/2015 11:13 AM

## 2016-10-14 ENCOUNTER — Other Ambulatory Visit: Payer: Self-pay | Admitting: Urgent Care

## 2016-10-14 DIAGNOSIS — K148 Other diseases of tongue: Secondary | ICD-10-CM

## 2017-06-20 ENCOUNTER — Encounter (HOSPITAL_COMMUNITY): Payer: Self-pay | Admitting: *Deleted

## 2017-06-20 ENCOUNTER — Ambulatory Visit (HOSPITAL_COMMUNITY)
Admission: EM | Admit: 2017-06-20 | Discharge: 2017-06-20 | Disposition: A | Discharge status: Home / Self Care | Payer: 59 | Attending: Family Medicine | Admitting: Family Medicine

## 2017-06-20 DIAGNOSIS — R05 Cough: Secondary | ICD-10-CM | POA: Diagnosis not present

## 2017-06-20 DIAGNOSIS — J029 Acute pharyngitis, unspecified: Secondary | ICD-10-CM

## 2017-06-20 DIAGNOSIS — M791 Myalgia, unspecified site: Secondary | ICD-10-CM

## 2017-06-20 DIAGNOSIS — R059 Cough, unspecified: Secondary | ICD-10-CM

## 2017-06-20 DIAGNOSIS — J111 Influenza due to unidentified influenza virus with other respiratory manifestations: Secondary | ICD-10-CM | POA: Diagnosis not present

## 2017-06-20 MED ORDER — ACETAMINOPHEN 325 MG PO TABS
650.0000 mg | ORAL_TABLET | Freq: Once | ORAL | Status: AC
  Administered 2017-06-20: 650 mg via ORAL

## 2017-06-20 MED ORDER — HYDROCODONE-HOMATROPINE 5-1.5 MG/5ML PO SYRP: 5.0000 mL | ORAL_SOLUTION | Freq: Four times a day (QID) | ORAL | 0 refills | Status: DC | PRN

## 2017-06-20 MED ORDER — ACETAMINOPHEN 325 MG PO TABS
ORAL_TABLET | ORAL | Status: AC
  Filled 2017-06-20: qty 2

## 2017-06-20 NOTE — ED Provider Notes (Signed)
[facility]    CSN: [id number] Arrival date & time: 06/20/17  1236     History   Chief Complaint Chief Complaint  Patient presents with  . Influenza    HPI Brandon Curtis is a 57 y.o. male.   Who presents with a 4 day history of dry cough, fevers, headaches, chills and body aches. He did "sweat a lot" last night and is hopeful that he feels a "tiny bit better". He denies exposures. He did not have the FLU vaccine.       Past Medical History:  Diagnosis Date  . Arthritis     There are no active problems to display for this patient.   History reviewed. No pertinent surgical history.     Home Medications    Prior to Admission medications   Medication Sig Start Date End Date Taking? Authorizing Provider  cetirizine (ZYRTEC) 10 MG tablet Take 1 tablet (10 mg total) by mouth daily. 10/11/15   Wallis Bamberg, PA-C  chlorhexidine (PERIDEX) 0.12 % solution Use as directed 15 mLs in the mouth or throat 2 (two) times daily. 10/29/15   Wallis Bamberg, PA-C  doxycycline (MONODOX) 100 MG capsule Take 1 capsule (100 mg total) by mouth 2 (two) times daily. 10/11/15   Wallis Bamberg, PA-C  naproxen sodium (ANAPROX DS) 550 MG tablet Take 1 tablet (550 mg total) by mouth 2 (two) times daily with a meal. 10/11/15   Wallis Bamberg, PA-C    Family History History reviewed. No pertinent family history.  Social History Social History   Tobacco Use  . Smoking status: Former Smoker    Types: Cigarettes    Last attempt to quit: 10/05/2014    Years since quitting: 2.7  . Smokeless tobacco: Never Used  Substance Use Topics  . Alcohol use: Yes    Alcohol/week: 1.2 oz    Types: 2 Standard drinks or equivalent per week    Comment: 2 drinks a week  . Drug use: No     Allergies   Patient has no known allergies.   Review of Systems Review of Systems  Constitutional: Positive for fatigue and fever.  HENT: Positive for sore throat.        Sore throat from coughing  Respiratory:  Positive for cough. Negative for shortness of breath.   Gastrointestinal: Negative.   Musculoskeletal: Positive for arthralgias.  Neurological: Negative.   Psychiatric/Behavioral: Negative.      Physical Exam Triage Vital Signs ED Triage Vitals  Enc Vitals Group     BP 06/20/17 1343 131/78     Pulse Rate 06/20/17 1343 100     Resp 06/20/17 1343 17     Temp 06/20/17 1343 (!) 100.8 F (38.2 C)     Temp Source 06/20/17 1343 Oral     SpO2 06/20/17 1343 100 %     Weight --      Height --      Head Circumference --      Peak Flow --      Pain Score 06/20/17 1341 9     Pain Loc --      Pain Edu? --      Excl. in GC? --    No data found.  Updated Vital Signs BP 131/78 (BP Location: Left Arm)   Pulse 100   Temp (!) 100.8 F (38.2 C) (Oral)   Resp 17   SpO2 100%   Visual Acuity Right Eye Distance:   Left Eye Distance:  Bilateral Distance:    Right Eye Near:   Left Eye Near:    Bilateral Near:     Physical Exam  Constitutional: He is oriented to person, place, and time. He appears well-developed and well-nourished. No distress.  HENT:  Right Ear: External ear normal.  Left Ear: External ear normal.  Mouth/Throat: Oropharynx is clear and moist.  Neck: Normal range of motion. Neck supple.  Cardiovascular: Regular rhythm.  Slightly tachycardic  Pulmonary/Chest: Effort normal and breath sounds normal.  Lymphadenopathy:    He has no cervical adenopathy.  Neurological: He is alert and oriented to person, place, and time.  Skin: Skin is warm and dry. He is not diaphoretic.  Psychiatric: His behavior is normal.  Nursing note and vitals reviewed.    UC Treatments / Results  Labs (all labs ordered are listed, but only abnormal results are displayed) Labs Reviewed - No data to display  EKG  EKG Interpretation None       Radiology No results found.  Procedures Procedures (including critical care time)  Medications Ordered in UC Medications    acetaminophen (TYLENOL) tablet 650 mg (650 mg Oral Given 06/20/17 1348)     Initial Impression / Assessment and Plan / UC Course  I have reviewed the triage vital signs and the nursing notes.  Pertinent labs & imaging results that were available during my care of the patient were reviewed by me and considered in my medical decision making (see chart for details).     Treat for presumed flu. Out of window for Tamiflu. Treat symptomatically as outlined. FU if worsens.   Final Clinical Impressions(s) / UC Diagnoses   Final diagnoses:  None    ED Discharge Orders    None       Controlled Substance Prescriptions [place] Controlled Substance Registry consulted? Not Applicable   Sharin MonsYoung, Jessy Calixte G, PA-C 06/20/17 1410

## 2017-06-20 NOTE — Discharge Instructions (Signed)
You most likely have the flu, but the good news is that it generally last 5-7 days, so hopefully you are on the mend. Treat with Motrin 600mg  (but over the counter-3 tabls) every 8 hours for fever and body aches. The liquid cough medication will help cough and allow you to rest. Sip on liquids throughout the day and rest. If you find your self worsening then please return.

## 2017-06-20 NOTE — ED Triage Notes (Addendum)
Patient reports flu like symptoms since Wednesday-unsure of fever but states lots of chills. Reports headache, discomfort to eyes, cough, poor appetite. Patient states that he had left over amoxicillin at home and has taken that with no relief.

## 2017-10-12 ENCOUNTER — Encounter (HOSPITAL_COMMUNITY): Payer: Self-pay | Admitting: Emergency Medicine

## 2017-10-12 ENCOUNTER — Ambulatory Visit (HOSPITAL_COMMUNITY)
Admission: EM | Admit: 2017-10-12 | Discharge: 2017-10-12 | Disposition: A | Discharge status: Home / Self Care | Payer: 59 | Attending: Internal Medicine | Admitting: Internal Medicine

## 2017-10-12 DIAGNOSIS — M5412 Radiculopathy, cervical region: Secondary | ICD-10-CM

## 2017-10-12 DIAGNOSIS — R202 Paresthesia of skin: Secondary | ICD-10-CM

## 2017-10-12 MED ORDER — METHYLPREDNISOLONE SODIUM SUCC 125 MG IJ SOLR
80.0000 mg | Freq: Once | INTRAMUSCULAR | Status: AC
Start: 2017-10-12 — End: 2017-10-12
  Administered 2017-10-12: 80 mg via INTRAMUSCULAR

## 2017-10-12 MED ORDER — METHYLPREDNISOLONE SODIUM SUCC 125 MG IJ SOLR
INTRAMUSCULAR | Status: AC
  Filled 2017-10-12: qty 2

## 2017-10-12 MED ORDER — KETOROLAC TROMETHAMINE 60 MG/2ML IM SOLN
INTRAMUSCULAR | Status: AC
  Filled 2017-10-12: qty 2

## 2017-10-12 MED ORDER — PREDNISONE 50 MG PO TABS: 50.0000 mg | ORAL_TABLET | Freq: Every day | ORAL | 0 refills | Status: AC

## 2017-10-12 MED ORDER — KETOROLAC TROMETHAMINE 60 MG/2ML IM SOLN
60.0000 mg | Freq: Once | INTRAMUSCULAR | Status: AC
  Administered 2017-10-12: 60 mg via INTRAMUSCULAR

## 2017-10-12 MED ORDER — NAPROXEN 500 MG PO TABS: 500.0000 mg | ORAL_TABLET | Freq: Two times a day (BID) | ORAL | 0 refills | Status: DC

## 2017-10-12 NOTE — ED Triage Notes (Addendum)
PT reports intermittent numbness in right hand and fingers for a long time that is due to a pinched nerve in his neck. 3 days ago he developed numbness in left fourth and fifth fingers.

## 2017-10-12 NOTE — Discharge Instructions (Signed)
Numbness (paresthesia) in left 4th, 5th fingers may be coming from pinched nerve in neck or irritation of nerve near funny bone in elbow.  Both can be aggravated by position (couch, bed, vehicle) or vibration/impacts (in/out of bucket at work).  Injections of ketorolac (anti inflammatory/pain reliever) and solumedrol (steroid) were given at the urgent care today, to try to decrease pain/numbness.  Prescriptions for prednisone (steroid) and naproxen (anti inflammatory/pain reliever) were sent to the pharmacy.  Anticipate gradual improvement over the next several days.  Followup with [facility] for a physical later this week as planned.  Could also followup with Occupational Medicine clinic to discuss next steps if not improving as expected.

## 2017-10-12 NOTE — ED Provider Notes (Signed)
[facility]    CSN: [id number] Arrival date & time: 10/12/17  1333     History   Chief Complaint Chief Complaint  Patient presents with  . Numbness    HPI Brandon Curtis is a 57 y.o. male.   He presents today with decreased sensation in the left fourth and fifth fingers, this started a few days ago.  He has been off work for the holiday, usually very active but has spent more time than usual on the couch or in the bed.  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  He has chronic pain in the right rhomboid area, with persistent decreased sensation in the right fourth and fifth fingers.  No weakness or clumsiness in the arms or legs reported, no change in bowel or bladder function.  Able to walk into the urgent care independently, and climb on/off the exam table   HPI  Past Medical History:  Diagnosis Date  . Arthritis     History reviewed. No pertinent surgical history.     Home Medications    Prior to Admission medications   Medication Sig Start Date End Date Taking? Authorizing Provider  cetirizine (ZYRTEC) 10 MG tablet Take 1 tablet (10 mg total) by mouth daily. 10/11/15   Wallis Bamberg, PA-C  chlorhexidine (PERIDEX) 0.12 % solution Use as directed 15 mLs in the mouth or throat 2 (two) times daily. 10/29/15   Wallis Bamberg, PA-C  doxycycline (MONODOX) 100 MG capsule Take 1 capsule (100 mg total) by mouth 2 (two) times daily. 10/11/15   Wallis Bamberg, PA-C  HYDROcodone-homatropine [facility]) 5-1.5 MG/5ML syrup Take 5 mLs by mouth every 6 (six) hours as needed for cough. 06/20/17   Riki Sheer, PA-C  naproxen (NAPROSYN) 500 MG tablet Take 1 tablet (500 mg total) by mouth 2 (two) times daily. 10/12/17   Isa Rankin, MD  naproxen sodium (ANAPROX DS) 550 MG tablet Take 1 tablet (550 mg total) by mouth 2 (two) times daily with a meal. 10/11/15   Wallis Bamberg, PA-C  predniSONE (DELTASONE) 50 MG tablet Take  1 tablet (50 mg total) by mouth daily for 5 days. 10/12/17 10/17/17  Isa Rankin, MD    Family History No family history on file.  Social History Social History   Tobacco Use  . Smoking status: Former Smoker    Types: Cigarettes    Last attempt to quit: 10/05/2014    Years since quitting: 3.0  . Smokeless tobacco: Never Used  Substance Use Topics  . Alcohol use: Yes    Alcohol/week: 1.2 oz    Types: 2 Standard drinks or equivalent per week    Comment: 2 drinks a week  . Drug use: No     Allergies   Patient has no known allergies.   Review of Systems Review of Systems  All other systems reviewed and are negative.    Physical Exam Triage Vital Signs ED Triage Vitals  Enc Vitals Group     BP 10/12/17 1401 140/84     Pulse Rate 10/12/17 1401 (!) 104     Resp 10/12/17 1401 16     Temp 10/12/17 1401 98 F (36.7 C)     Temp Source 10/12/17 1401 Temporal     SpO2 10/12/17 1401 100 %     Weight 10/12/17 1402 160 lb (72.6 kg)     Height --  Pain Score 10/12/17 1401 4     Pain Loc --    Updated Vital Signs BP 140/84   Pulse (!) 104   Temp 98 F (36.7 C) (Temporal)   Resp 16   Wt 160 lb (72.6 kg)   SpO2 100%   BMI 23.63 kg/m  Physical Exam  Constitutional: He is oriented to person, place, and time. No distress.  Alert, nicely groomed  HENT:  Head: Atraumatic.  Eyes:  Conjugate gaze, no eye redness/drainage  Neck: Neck supple.  Cardiovascular: Normal rate.  Pulmonary/Chest: No respiratory distress.  Lungs clear, symmetric breath sounds  Abdominal: He exhibits no distension.  Musculoskeletal: Normal range of motion.  Range of motion at both shoulders is symmetric, able to fully extend arms overhead, internally and externally rotate.  Mild tenderness to palpation of the right rhomboid area.  Able to turn head left and right with mild discomfort in the right rhomboid appreciated with left head turn.  This is also evident on full neck flexion.  Able to  fully extend neck.  Neurological: He is alert and oriented to person, place, and time.  Skin: Skin is warm and dry.  No cyanosis  Nursing note and vitals reviewed.    UC Treatments / Results  Labs (all labs ordered are listed, but only abnormal results are displayed) Labs Reviewed - No data to display  EKG None  Radiology No results found.  Procedures Procedures (including critical care time)  Medications Ordered in UC Medications  ketorolac (TORADOL) injection 60 mg (60 mg Intramuscular Given 10/12/17 1643)  methylPREDNISolone sodium succinate (SOLU-MEDROL) 125 mg/2 mL injection 80 mg (80 mg Intramuscular Given 10/12/17 1644)   Final Clinical Impressions(s) / UC Diagnoses   Final diagnoses:  Paresthesia  Right cervical radiculopathy     Discharge Instructions     Numbness (paresthesia) in left 4th, 5th fingers may be coming from pinched nerve in neck or irritation of nerve near funny bone in elbow.  Both can be aggravated by position (couch, bed, vehicle) or vibration/impacts (in/out of bucket at work).  Injections of ketorolac (anti inflammatory/pain reliever) and solumedrol (steroid) were given at the urgent care today, to try to decrease pain/numbness.  Prescriptions for prednisone (steroid) and naproxen (anti inflammatory/pain reliever) were sent to the pharmacy.  Anticipate gradual improvement over the next several days.  Followup with [facility] for a physical later this week as planned.  Could also followup with Occupational Medicine clinic to discuss next steps if not improving as expected.     ED Prescriptions    Medication Sig Dispense Auth. Provider   predniSONE (DELTASONE) 50 MG tablet Take 1 tablet (50 mg total) by mouth daily for 5 days. 5 tablet Isa Rankin, MD   naproxen (NAPROSYN) 500 MG tablet Take 1 tablet (500 mg total) by mouth 2 (two) times daily. 30 tablet Isa Rankin, MD        Isa Rankin, MD 10/16/17 2207

## 2017-10-15 ENCOUNTER — Ambulatory Visit (INDEPENDENT_AMBULATORY_CARE_PROVIDER_SITE_OTHER): Payer: 59

## 2017-10-15 ENCOUNTER — Encounter: Payer: Self-pay | Admitting: Urgent Care

## 2017-10-15 ENCOUNTER — Ambulatory Visit (INDEPENDENT_AMBULATORY_CARE_PROVIDER_SITE_OTHER): Payer: 59 | Admitting: Urgent Care

## 2017-10-15 ENCOUNTER — Other Ambulatory Visit: Payer: Self-pay

## 2017-10-15 VITALS — BP 126/83 | HR 81 | Temp 98.4°F | Resp 18 | Ht 67.24 in | Wt 157.6 lb

## 2017-10-15 DIAGNOSIS — Z1211 Encounter for screening for malignant neoplasm of colon: Secondary | ICD-10-CM

## 2017-10-15 DIAGNOSIS — Z0000 Encounter for general adult medical examination without abnormal findings: Secondary | ICD-10-CM | POA: Diagnosis not present

## 2017-10-15 DIAGNOSIS — Z1159 Encounter for screening for other viral diseases: Secondary | ICD-10-CM | POA: Diagnosis not present

## 2017-10-15 DIAGNOSIS — M542 Cervicalgia: Secondary | ICD-10-CM

## 2017-10-15 DIAGNOSIS — Z1329 Encounter for screening for other suspected endocrine disorder: Secondary | ICD-10-CM | POA: Diagnosis not present

## 2017-10-15 DIAGNOSIS — Z130 Encounter for screening for diseases of the blood and blood-forming organs and certain disorders involving the immune mechanism: Secondary | ICD-10-CM

## 2017-10-15 DIAGNOSIS — R1084 Generalized abdominal pain: Secondary | ICD-10-CM

## 2017-10-15 DIAGNOSIS — Z114 Encounter for screening for human immunodeficiency virus [HIV]: Secondary | ICD-10-CM | POA: Diagnosis not present

## 2017-10-15 MED ORDER — SUCRALFATE 1 G PO TABS: 1.0000 g | ORAL_TABLET | Freq: Three times a day (TID) | ORAL | 2 refills | Status: DC

## 2017-10-15 NOTE — Patient Instructions (Addendum)
Hydrate well with at least 2 liters (1 gallon) of water daily.     Dehydration, Adult Dehydration is a condition in which there is not enough fluid or water in the body. This happens when you lose more fluids than you take in. Important organs, such as the kidneys, brain, and heart, cannot function without a proper amount of fluids. Any loss of fluids from the body can lead to dehydration. Dehydration can range from mild to severe. This condition should be treated right away to prevent it from becoming severe. What are the causes? This condition may be caused by:  Vomiting.  Diarrhea.  Excessive sweating, such as from heat exposure or exercise.  Not drinking enough fluid, especially: ? When ill. ? While doing activity that requires a lot of energy.  Excessive urination.  Fever.  Infection.  Certain medicines, such as medicines that cause the body to lose excess fluid (diuretics).  Inability to access safe drinking water.  Reduced physical ability to get adequate water and food.  What increases the risk? This condition is more likely to develop in people:  Who have a poorly controlled long-term (chronic) illness, such as diabetes, heart disease, or kidney disease.  Who are age 46 or older.  Who are disabled.  Who live in a place with high altitude.  Who play endurance sports.  What are the signs or symptoms? Symptoms of mild dehydration may include:  Thirst.  Dry lips.  Slightly dry mouth.  Dry, warm skin.  Dizziness. Symptoms of moderate dehydration may include:  Very dry mouth.  Muscle cramps.  Dark urine. Urine may be the color of tea.  Decreased urine production.  Decreased tear production.  Heartbeat that is irregular or faster than normal (palpitations).  Headache.  Light-headedness, especially when you stand up from a sitting position.  Fainting (syncope). Symptoms of severe dehydration may include:  Changes in skin, such as: ? Cold  and clammy skin. ? Blotchy (mottled) or pale skin. ? Skin that does not quickly return to normal after being lightly pinched and released (poor skin turgor).  Changes in body fluids, such as: ? Extreme thirst. ? No tear production. ? Inability to sweat when body temperature is high, such as in hot weather. ? Very little urine production.  Changes in vital signs, such as: ? Weak pulse. ? Pulse that is more than 100 beats a minute when sitting still. ? Rapid breathing. ? Low blood pressure.  Other changes, such as: ? Sunken eyes. ? Cold hands and feet. ? Confusion. ? Lack of energy (lethargy). ? Difficulty waking up from sleep. ? Short-term weight loss. ? Unconsciousness. How is this diagnosed? This condition is diagnosed based on your symptoms and a physical exam. Blood and urine tests may be done to help confirm the diagnosis. How is this treated? Treatment for this condition depends on the severity. Mild or moderate dehydration can often be treated at home. Treatment should be started right away. Do not wait until dehydration becomes severe. Severe dehydration is an emergency and it needs to be treated in a hospital. Treatment for mild dehydration may include:  Drinking more fluids.  Replacing salts and minerals in your blood (electrolytes) that you may have lost. Treatment for moderate dehydration may include:  Drinking an oral rehydration solution (ORS). This is a drink that helps you replace fluids and electrolytes (rehydrate). It can be found at pharmacies and retail stores. Treatment for severe dehydration may include:  Receiving fluids through an IV  tube.  Receiving an electrolyte solution through a feeding tube that is passed through your nose and into your stomach (nasogastric tube, or NG tube).  Correcting any abnormalities in electrolytes.  Treating the underlying cause of dehydration. Follow these instructions at home:  If directed by your health care  provider, drink an ORS: ? Make an ORS by following instructions on the package. ? Start by drinking small amounts, about  cup (120 mL) every 5-10 minutes. ? Slowly increase how much you drink until you have taken the amount recommended by your health care provider.  Drink enough clear fluid to keep your urine clear or pale yellow. If you were told to drink an ORS, finish the ORS first, then start slowly drinking other clear fluids. Drink fluids such as: ? Water. Do not drink only water. Doing that can lead to having too little salt (sodium) in the body (hyponatremia). ? Ice chips. ? Fruit juice that you have added water to (diluted fruit juice). ? Low-calorie sports drinks.  Avoid: ? Alcohol. ? Drinks that contain a lot of sugar. These include high-calorie sports drinks, fruit juice that is not diluted, and soda. ? Caffeine. ? Foods that are greasy or contain a lot of fat or sugar.  Take over-the-counter and prescription medicines only as told by your health care provider.  Do not take sodium tablets. This can lead to having too much sodium in the body (hypernatremia).  Eat foods that contain a healthy balance of electrolytes, such as bananas, oranges, potatoes, tomatoes, and spinach.  Keep all follow-up visits as told by your health care provider. This is important. Contact a health care provider if:  You have abdominal pain that: ? Gets worse. ? Stays in one area (localizes).  You have a rash.  You have a stiff neck.  You are more irritable than usual.  You are sleepier or more difficult to wake up than usual.  You feel weak or dizzy.  You feel very thirsty.  You have urinated only a small amount of very dark urine over 6-8 hours. Get help right away if:  You have symptoms of severe dehydration.  You cannot drink fluids without vomiting.  Your symptoms get worse with treatment.  You have a fever.  You have a severe headache.  You have vomiting or diarrhea  that: ? Gets worse. ? Does not go away.  You have blood or green matter (bile) in your vomit.  You have blood in your stool. This may cause stool to look black and tarry.  You have not urinated in 6-8 hours.  You faint.  Your heart rate while sitting still is over 100 beats a minute.  You have trouble breathing. This information is not intended to replace advice given to you by your health care provider. Make sure you discuss any questions you have with your health care provider. Document Released: 05/04/2005 Document Revised: 11/29/2015 Document Reviewed: 06/28/2015 Elsevier Interactive Patient Education  2018 [employer].     Food Choices for Gastroesophageal Reflux Disease, Adult When you have gastroesophageal reflux disease (GERD), the foods you eat and your eating habits are very important. Choosing the right foods can help ease the discomfort of GERD. Consider working with a diet and nutrition specialist (dietitian) to help you make healthy food choices. What general guidelines should I follow? Eating plan  Choose healthy foods low in fat, such as fruits, vegetables, whole grains, low-fat dairy products, and lean meat, fish, and poultry.  Eat frequent,  small meals instead of three large meals each day. Eat your meals slowly, in a relaxed setting. Avoid bending over or lying down until 2-3 hours after eating.  Limit high-fat foods such as fatty meats or fried foods.  Limit your intake of oils, butter, and shortening to less than 8 teaspoons each day.  Avoid the following: ? Foods that cause symptoms. These may be different for different people. Keep a food diary to keep track of foods that cause symptoms. ? Alcohol. ? Drinking large amounts of liquid with meals. ? Eating meals during the 2-3 hours before bed.  Cook foods using methods other than frying. This may include baking, grilling, or broiling. Lifestyle   Maintain a healthy weight. Ask your health care  provider what weight is healthy for you. If you need to lose weight, work with your health care provider to do so safely.  Exercise for at least 30 minutes on 5 or more days each week, or as told by your health care provider.  Avoid wearing clothes that fit tightly around your waist and chest.  Do not use any products that contain nicotine or tobacco, such as cigarettes and e-cigarettes. If you need help quitting, ask your health care provider.  Sleep with the head of your bed raised. Use a wedge under the mattress or blocks under the bed frame to raise the head of the bed. What foods are not recommended? The items listed may not be a complete list. Talk with your dietitian about what dietary choices are best for you. Grains Pastries or quick breads with added fat. [place] toast. Vegetables Deep fried vegetables. [place] fries. Any vegetables prepared with added fat. Any vegetables that cause symptoms. For some people this may include tomatoes and tomato products, chili peppers, onions and garlic, and horseradish. Fruits Any fruits prepared with added fat. Any fruits that cause symptoms. For some people this may include citrus fruits, such as oranges, grapefruit, pineapple, and lemons. Meats and other protein foods High-fat meats, such as fatty beef or pork, hot dogs, ribs, ham, sausage, salami and bacon. Fried meat or protein, including fried fish and fried chicken. Nuts and nut butters. Dairy Whole milk and chocolate milk. Sour cream. Cream. Ice cream. Cream cheese. Milk shakes. Beverages Coffee and tea, with or without caffeine. Carbonated beverages. Sodas. Energy drinks. Fruit juice made with acidic fruits (such as orange or grapefruit). Tomato juice. Alcoholic drinks. Fats and oils Butter. Margarine. Shortening. Ghee. Sweets and desserts Chocolate and cocoa. Donuts. Seasoning and other foods Pepper. Peppermint and spearmint. Any condiments, herbs, or seasonings that cause symptoms. For  some people, this may include curry, hot sauce, or vinegar-based salad dressings. Summary  When you have gastroesophageal reflux disease (GERD), food and lifestyle choices are very important to help ease the discomfort of GERD.  Eat frequent, small meals instead of three large meals each day. Eat your meals slowly, in a relaxed setting. Avoid bending over or lying down until 2-3 hours after eating.  Limit high-fat foods such as fatty meat or fried foods. This information is not intended to replace advice given to you by your health care provider. Make sure you discuss any questions you have with your health care provider. Document Released: 05/04/2005 Document Revised: 05/05/2016 Document Reviewed: 05/05/2016 Elsevier Interactive Patient Education  2018 [employer]     Abdominal Pain, Adult Abdominal pain can be caused by many things. Often, abdominal pain is not serious and it gets better with no treatment or by  being treated at home. However, sometimes abdominal pain is serious. Your health care provider will do a medical history and a physical exam to try to determine the cause of your abdominal pain. Follow these instructions at home:  Take over-the-counter and prescription medicines only as told by your health care provider. Do not take a laxative unless told by your health care provider.  Drink enough fluid to keep your urine clear or pale yellow.  Watch your condition for any changes.  Keep all follow-up visits as told by your health care provider. This is important. Contact a health care provider if:  Your abdominal pain changes or gets worse.  You are not hungry or you lose weight without trying.  You are constipated or have diarrhea for more than 2-3 days.  You have pain when you urinate or have a bowel movement.  Your abdominal pain wakes you up at night.  Your pain gets worse with meals, after eating, or with certain foods.  You are throwing up and cannot keep  anything down.  You have a fever. Get help right away if:  Your pain does not go away as soon as your health care provider told you to expect.  You cannot stop throwing up.  Your pain is only in areas of the abdomen, such as the right side or the left lower portion of the abdomen.  You have bloody or black stools, or stools that look like tar.  You have severe pain, cramping, or bloating in your abdomen.  You have signs of dehydration, such as: ? Dark urine, very little urine, or no urine. ? Cracked lips. ? Dry mouth. ? Sunken eyes. ? Sleepiness. ? Weakness. This information is not intended to replace advice given to you by your health care provider. Make sure you discuss any questions you have with your health care provider. Document Released: 02/11/2005 Document Revised: 11/22/2015 Document Reviewed: 10/16/2015 Elsevier Interactive Patient Education  2018 [employer].    Health Maintenance, Male A healthy lifestyle and preventive care is important for your health and wellness. Ask your health care provider about what schedule of regular examinations is right for you. What should I know about weight and diet? Eat a Healthy Diet  Eat plenty of vegetables, fruits, whole grains, low-fat dairy products, and lean protein.  Do not eat a lot of foods high in solid fats, added sugars, or salt.  Maintain a Healthy Weight Regular exercise can help you achieve or maintain a healthy weight. You should:  Do at least 150 minutes of exercise each week. The exercise should increase your heart rate and make you sweat (moderate-intensity exercise).  Do strength-training exercises at least twice a week.  Watch Your Levels of Cholesterol and Blood Lipids  Have your blood tested for lipids and cholesterol every 5 years starting at 59 years of age. If you are at high risk for heart disease, you should start having your blood tested when you are 59 years old. You may need to have your  cholesterol levels checked more often if: ? Your lipid or cholesterol levels are high. ? You are older than 59 years of age. ? You are at high risk for heart disease.  What should I know about cancer screening? Many types of cancers can be detected early and may often be prevented. Lung Cancer  You should be screened every year for lung cancer if: ? You are a current smoker who has smoked for at least 30 years. ?  You are a former smoker who has quit within the past 15 years.  Talk to your health care provider about your screening options, when you should start screening, and how often you should be screened.  Colorectal Cancer  Routine colorectal cancer screening usually begins at 59 years of age and should be repeated every 5-10 years until you are 59 years old. You may need to be screened more often if early forms of precancerous polyps or small growths are found. Your health care provider may recommend screening at an earlier age if you have risk factors for colon cancer.  Your health care provider may recommend using home test kits to check for hidden blood in the stool.  A small camera at the end of a tube can be used to examine your colon (sigmoidoscopy or colonoscopy). This checks for the earliest forms of colorectal cancer.  Prostate and Testicular Cancer  Depending on your age and overall health, your health care provider may do certain tests to screen for prostate and testicular cancer.  Talk to your health care provider about any symptoms or concerns you have about testicular or prostate cancer.  Skin Cancer  Check your skin from head to toe regularly.  Tell your health care provider about any new moles or changes in moles, especially if: ? There is a change in a mole's size, shape, or color. ? You have a mole that is larger than a pencil eraser.  Always use sunscreen. Apply sunscreen liberally and repeat throughout the day.  Protect yourself by wearing long sleeves,  pants, a wide-brimmed hat, and sunglasses when outside.  What should I know about heart disease, diabetes, and high blood pressure?  If you are 65-13 years of age, have your blood pressure checked every 3-5 years. If you are 43 years of age or older, have your blood pressure checked every year. You should have your blood pressure measured twice-once when you are at a hospital or clinic, and once when you are not at a hospital or clinic. Record the average of the two measurements. To check your blood pressure when you are not at a hospital or clinic, you can use: ? An automated blood pressure machine at a pharmacy. ? A home blood pressure monitor.  Talk to your health care provider about your target blood pressure.  If you are between 59-42 years old, ask your health care provider if you should take aspirin to prevent heart disease.  Have regular diabetes screenings by checking your fasting blood sugar level. ? If you are at a normal weight and have a low risk for diabetes, have this test once every three years after the age of 85. ? If you are overweight and have a high risk for diabetes, consider being tested at a younger age or more often.  A one-time screening for abdominal aortic aneurysm (AAA) by ultrasound is recommended for men aged 65-75 years who are current or former smokers. What should I know about preventing infection? Hepatitis B If you have a higher risk for hepatitis B, you should be screened for this virus. Talk with your health care provider to find out if you are at risk for hepatitis B infection. Hepatitis C Blood testing is recommended for:  Everyone born from [birth year] through 1967.  Anyone with known risk factors for hepatitis C.  Sexually Transmitted Diseases (STDs)  You should be screened each year for STDs including gonorrhea and chlamydia if: ? You are sexually active and are younger  than 59 years of age. ? You are older than 59 years of age and your health care  provider tells you that you are at risk for this type of infection. ? Your sexual activity has changed since you were last screened and you are at an increased risk for chlamydia or gonorrhea. Ask your health care provider if you are at risk.  Talk with your health care provider about whether you are at high risk of being infected with HIV. Your health care provider may recommend a prescription medicine to help prevent HIV infection.  What else can I do?  Schedule regular health, dental, and eye exams.  Stay current with your vaccines (immunizations).  Do not use any tobacco products, such as cigarettes, chewing tobacco, and e-cigarettes. If you need help quitting, ask your health care provider.  Limit alcohol intake to no more than 2 drinks per day. One drink equals 12 ounces of beer, 5 ounces of wine, or 1 ounces of hard liquor.  Do not use street drugs.  Do not share needles.  Ask your health care provider for help if you need support or information about quitting drugs.  Tell your health care provider if you often feel depressed.  Tell your health care provider if you have ever been abused or do not feel safe at home. This information is not intended to replace advice given to you by your health care provider. Make sure you discuss any questions you have with your health care provider. Document Released: 10/31/2007 Document Revised: 01/01/2016 Document Reviewed: 02/05/2015 Elsevier Interactive Patient Education  2018 [employer].     IF you received an x-ray today, you will receive an invoice from [facility]. Please contact [facility] at [phone] with questions or concerns regarding your invoice.   IF you received labwork today, you will receive an invoice from [facility]. Please contact [facility] at [phone] with questions or concerns regarding your invoice.   Our billing staff will not be able to assist you with questions regarding bills from these  companies.  You will be contacted with the lab results as soon as they are available. The fastest way to get your results is to activate your My Chart account. Instructions are located on the last page of this paperwork. If you have not heard from [place] regarding the results in 2 weeks, please contact this office.

## 2017-10-15 NOTE — Progress Notes (Signed)
MRN: [id number] DOB: June 07, 1960  Subjective:   Brandon Curtis is a 57 y.o. male presenting for an annual physical and work up of belly pain, tingling. Patient has a significant other. Patient works as a Probation officer, Buyer, retail, does a lot of climbing, heavy and strenuous work. Has good relationships at home, has a good support network.  Denies drug use. Has occasional drink of wine. Quit smoking 1 week ago. Used to smoke 1 pack of cigarettes every 2 weeks. Does not hydrate well, does 1 bottle of water per day.  PCP: Wallis Bamberg, PA-C Vision: Wears reading glasses occasionally. Dental: Plans on doing dental care soon.  Specialists: None currently.  Health maintenance: needs to have a colonoscopy. Up to date on immunizations.  Belly pain - Reports 1 week history of upper abdominal pain. Pain is intermittent, relieved temporarily with Pepto and otc antacids, Rolaids. Symptoms started after he ate spicy foods, sausage and citrus foods. Has had similar symptoms in the past. He saw a GI doctor in the past for this, was told that he had "acid build up hurting his stomach lining". Denies fever, n/v, bloody stools, constipation, diarrhea.   Tingling - Patient is currently undergoing a steroid course for tingling of his hands bilaterally. He was seen at an urgent care and advised to follow up with [place] for the same. Admits that his belly pain started prior to the steroid course. 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 Does not have an orthopedist.   Farzad has a current medication list which includes the following prescription(s): naproxen and prednisone. Also has No Known Allergies.  Hatcher  has a past medical history of Arthritis. Denies past surgical history. His family history includes Diabetes in his sister.   Objective:   Vitals: BP 126/83 (BP Location: Right Arm, Patient Position: Sitting, Cuff Size: Normal)   Pulse 81   Temp 98.4 F (36.9 C) (Oral)   Resp 18   Ht 5' 7.24" (1.708 m)    Wt 157 lb 9.6 oz (71.5 kg)   SpO2 97%   BMI 24.50 kg/m   Physical Exam  Constitutional: He is oriented to person, place, and time. He appears well-developed and well-nourished.  HENT:  TM's intact bilaterally, no effusions or erythema. Nasal turbinates pink and moist, nasal passages patent. No sinus tenderness. Oropharynx clear, mucous membranes moist, dentition in good repair.  Eyes: Pupils are equal, round, and reactive to light. Conjunctivae and EOM are normal. Right eye exhibits no discharge. Left eye exhibits no discharge. No scleral icterus.  Neck: Normal range of motion. Neck supple. No thyromegaly present.  Cardiovascular: Normal rate, regular rhythm and intact distal pulses. Exam reveals no gallop and no friction rub.  No murmur heard. Pulmonary/Chest: No stridor. No respiratory distress. He has no wheezes. He has no rales.  Abdominal: Soft. Bowel sounds are normal. He exhibits no distension and no mass. There is no tenderness.  Musculoskeletal: Normal range of motion. He exhibits no edema or tenderness.       Cervical back: He exhibits normal range of motion, no tenderness, no bony tenderness, no swelling, no edema, no deformity, no laceration and no spasm.  Lymphadenopathy:    He has no cervical adenopathy.  Neurological: He is alert and oriented to person, place, and time. He has normal reflexes. He displays normal reflexes. Coordination normal.  Skin: Skin is warm and dry. No rash noted. No erythema. No pallor.  Psychiatric: He has a normal mood and  affect.   Dg Cervical Spine Complete  Result Date: 10/15/2017 CLINICAL DATA:  Neck pain EXAM: CERVICAL SPINE - COMPLETE 4+ VIEW COMPARISON:  None. FINDINGS: Seven cervical segments are well visualized. Vertebral body height is well maintained. Mild osteophytic changes are seen. Facet hypertrophic changes are noted as well. Mild neural foraminal narrowing is noted bilaterally at C4-5, C5-6 and C6-7. The odontoid is within normal  limits. No soft tissue changes are seen. IMPRESSION: Multilevel degenerative change without acute abnormality. Electronically Signed   By: Alcide Clever M.D.   On: 10/15/2017 14:29   Assessment and Plan :   Annual physical exam - Plan: Lipid panel, CANCELED: Hepatitis C antibody, CANCELED: HIV antibody, CANCELED: CBC, CANCELED: Comprehensive metabolic panel, CANCELED: TSH  Generalized abdominal pain - Plan: H. pylori breath test, Comprehensive metabolic panel, CBC  Screening for thyroid disorder - Plan: TSH  Screening for deficiency anemia - Plan: CBC  Screening for HIV without presence of risk factors - Plan: HIV antibody  Encounter for hepatitis C screening test for low risk patient - Plan: Hepatitis C antibody  Screen for colon cancer - Plan: Ambulatory referral to Gastroenterology  Neck pain - Plan: DG Cervical Spine Complete  PE - Patient is medically stable and very pleasant, labs pending. Discussed healthy lifestyle, diet, exercise, preventative care, vaccinations, and addressed patient's concerns.  Referral to GI pending for colonoscopy.  OV - I encouraged patient to finish the steroid course that was provided to him at the urgent care.  He can continue naproxen thereafter for his arthritic type pain.  If his tingling persists, patient is to return to clinic for recheck.  For his belly pain labs are pending but we will start him on sucralfate.  He states that he will look for his notes from a previous GI doctor visit and will let me know if he was prescribed medicine that helped him.  I will be happy to prescribe this to him once he lets me know what it is.  Wallis Bamberg, PA-C Primary [facility] at [facility] [phone] 10/15/2017  1:58 PM

## 2017-10-16 ENCOUNTER — Emergency Department (HOSPITAL_COMMUNITY): Payer: 59

## 2017-10-16 ENCOUNTER — Emergency Department (HOSPITAL_COMMUNITY)
Admission: EM | Admit: 2017-10-16 | Discharge: 2017-10-16 | Disposition: A | Discharge status: Home / Self Care | Payer: 59 | Attending: Emergency Medicine | Admitting: Emergency Medicine

## 2017-10-16 ENCOUNTER — Other Ambulatory Visit: Payer: Self-pay

## 2017-10-16 ENCOUNTER — Encounter (HOSPITAL_COMMUNITY): Payer: Self-pay | Admitting: Emergency Medicine

## 2017-10-16 DIAGNOSIS — Z79899 Other long term (current) drug therapy: Secondary | ICD-10-CM | POA: Insufficient documentation

## 2017-10-16 DIAGNOSIS — R1013 Epigastric pain: Secondary | ICD-10-CM | POA: Diagnosis not present

## 2017-10-16 DIAGNOSIS — R109 Unspecified abdominal pain: Secondary | ICD-10-CM | POA: Diagnosis present

## 2017-10-16 DIAGNOSIS — Z87891 Personal history of nicotine dependence: Secondary | ICD-10-CM | POA: Diagnosis not present

## 2017-10-16 DIAGNOSIS — R1011 Right upper quadrant pain: Secondary | ICD-10-CM

## 2017-10-16 LAB — CBC
HCT: 42.2 % (ref 39.0–52.0)
HEMATOCRIT: 42.2 % (ref 37.5–51.0)
Hemoglobin: 14.2 g/dL (ref 13.0–17.7)
Hemoglobin: 14 g/dL (ref 13.0–17.0)
MCH: 30.4 pg (ref 26.6–33.0)
MCH: 29.5 pg (ref 26.0–34.0)
MCHC: 33.6 g/dL (ref 31.5–35.7)
MCHC: 33.2 g/dL (ref 30.0–36.0)
MCV: 90 fL (ref 79–97)
MCV: 89 fL (ref 78.0–100.0)
PLATELETS: 216 10*3/uL (ref 150–400)
Platelets: 216 10*3/uL (ref 150–450)
RBC: 4.67 x10E6/uL (ref 4.14–5.80)
RBC: 4.74 MIL/uL (ref 4.22–5.81)
RDW: 13.9 % (ref 12.3–15.4)
RDW: 12.1 % (ref 11.5–15.5)
WBC: 6.5 10*3/uL (ref 3.4–10.8)
WBC: 6.7 10*3/uL (ref 4.0–10.5)

## 2017-10-16 LAB — COMPREHENSIVE METABOLIC PANEL
ALT: 38 IU/L (ref 0–44)
ALT: 41 U/L (ref 17–63)
AST: 36 IU/L (ref 0–40)
AST: 36 U/L (ref 15–41)
Albumin/Globulin Ratio: 1.6 (ref 1.2–2.2)
Albumin: 5 g/dL (ref 3.5–5.5)
Albumin: 4.4 g/dL (ref 3.5–5.0)
Alkaline Phosphatase: 58 IU/L (ref 39–117)
Alkaline Phosphatase: 51 U/L (ref 38–126)
Anion gap: 10 (ref 5–15)
BILIRUBIN TOTAL: 0.7 mg/dL (ref 0.3–1.2)
BUN/Creatinine Ratio: 15 (ref 9–20)
BUN: 17 mg/dL (ref 6–24)
BUN: 15 mg/dL (ref 6–20)
Bilirubin Total: 0.6 mg/dL (ref 0.0–1.2)
CALCIUM: 10.1 mg/dL (ref 8.7–10.2)
CALCIUM: 9.6 mg/dL (ref 8.9–10.3)
CO2: 27 mmol/L (ref 20–29)
CO2: 28 mmol/L (ref 22–32)
CREATININE: 1.15 mg/dL (ref 0.76–1.27)
CREATININE: 1 mg/dL (ref 0.61–1.24)
Chloride: 100 mmol/L (ref 96–106)
Chloride: 99 mmol/L — ABNORMAL LOW (ref 101–111)
GFR calc Af Amer: 81 mL/min/{1.73_m2} (ref >59)
GFR calc Af Amer: >60 mL/min (ref >60)
GFR, EST NON AFRICAN AMERICAN: 70 mL/min/{1.73_m2} (ref >59)
GLOBULIN, TOTAL: 3.2 g/dL (ref 1.5–4.5)
Glucose, Bld: 122 mg/dL — ABNORMAL HIGH (ref 65–99)
Glucose: 74 mg/dL (ref 65–99)
Potassium: 3.8 mmol/L (ref 3.5–5.2)
Potassium: 3.8 mmol/L (ref 3.5–5.1)
Sodium: 144 mmol/L (ref 134–144)
Sodium: 137 mmol/L (ref 135–145)
TOTAL PROTEIN: 7.7 g/dL (ref 6.5–8.1)
Total Protein: 8.2 g/dL (ref 6.0–8.5)

## 2017-10-16 LAB — URINALYSIS, ROUTINE W REFLEX MICROSCOPIC
Bilirubin Urine: NEGATIVE
GLUCOSE, UA: NEGATIVE mg/dL
Hgb urine dipstick: NEGATIVE
KETONES UR: NEGATIVE mg/dL
LEUKOCYTES UA: NEGATIVE
NITRITE: NEGATIVE
PROTEIN: NEGATIVE mg/dL
Specific Gravity, Urine: 1.025 (ref 1.005–1.030)
pH: 6 (ref 5.0–8.0)

## 2017-10-16 LAB — LIPID PANEL
CHOL/HDL RATIO: 3.3 ratio (ref 0.0–5.0)
Cholesterol, Total: 211 mg/dL — ABNORMAL HIGH (ref 100–199)
HDL: 63 mg/dL (ref >39)
LDL Calculated: 130 mg/dL — ABNORMAL HIGH (ref 0–99)
TRIGLYCERIDES: 90 mg/dL (ref 0–149)
VLDL Cholesterol Cal: 18 mg/dL (ref 5–40)

## 2017-10-16 LAB — HIV ANTIBODY (ROUTINE TESTING W REFLEX): HIV Screen 4th Generation wRfx: NONREACTIVE

## 2017-10-16 LAB — HEPATITIS C ANTIBODY: Hep C Virus Ab: >11 s/co ratio — ABNORMAL HIGH (ref 0.0–0.9)

## 2017-10-16 LAB — TSH: TSH: 1.4 u[IU]/mL (ref 0.450–4.500)

## 2017-10-16 LAB — LIPASE, BLOOD: Lipase: 30 U/L (ref 11–51)

## 2017-10-16 MED ORDER — ONDANSETRON 4 MG PO TBDP
4.0000 mg | ORAL_TABLET | Freq: Once | ORAL | Status: AC | PRN
  Administered 2017-10-16: 4 mg via ORAL
  Filled 2017-10-16: qty 1

## 2017-10-16 MED ORDER — MORPHINE SULFATE (PF) 4 MG/ML IV SOLN
4.0000 mg | Freq: Once | INTRAVENOUS | Status: AC
  Administered 2017-10-16: 4 mg via INTRAVENOUS
  Filled 2017-10-16: qty 1

## 2017-10-16 MED ORDER — ONDANSETRON 4 MG PO TBDP
4.0000 mg | ORAL_TABLET | Freq: Once | ORAL | Status: AC
  Administered 2017-10-16: 4 mg via ORAL
  Filled 2017-10-16: qty 1

## 2017-10-16 MED ORDER — SUCRALFATE 1 GM/10ML PO SUSP: 1.0000 g | Freq: Three times a day (TID) | ORAL | 0 refills | Status: DC

## 2017-10-16 MED ORDER — SODIUM CHLORIDE 0.9 % IV SOLN: Freq: Once | INTRAVENOUS | Status: DC

## 2017-10-16 MED ORDER — OMEPRAZOLE 20 MG PO CPDR: 20.0000 mg | DELAYED_RELEASE_CAPSULE | Freq: Every day | ORAL | 0 refills | Status: DC

## 2017-10-16 MED ORDER — ONDANSETRON HCL 4 MG/2ML IJ SOLN: 4.0000 mg | Freq: Once | INTRAMUSCULAR | Status: DC

## 2017-10-16 MED ORDER — FAMOTIDINE 20 MG PO TABS: 20.0000 mg | ORAL_TABLET | Freq: Two times a day (BID) | ORAL | 0 refills | Status: DC

## 2017-10-16 MED ORDER — GI COCKTAIL ~~LOC~~
30.0000 mL | Freq: Once | ORAL | Status: AC
  Administered 2017-10-16: 30 mL via ORAL
  Filled 2017-10-16: qty 30

## 2017-10-16 MED ORDER — PANTOPRAZOLE SODIUM 40 MG PO TBEC
40.0000 mg | DELAYED_RELEASE_TABLET | Freq: Once | ORAL | Status: AC
  Administered 2017-10-16: 40 mg via ORAL
  Filled 2017-10-16: qty 1

## 2017-10-16 MED ORDER — FAMOTIDINE 20 MG PO TABS
20.0000 mg | ORAL_TABLET | Freq: Once | ORAL | Status: AC
  Administered 2017-10-16: 20 mg via ORAL
  Filled 2017-10-16: qty 1

## 2017-10-16 NOTE — Discharge Instructions (Signed)
I suspect your symptoms are from either ulcers, gastritis or acid reflux.  All of these are treated similarly with antiacid and acid reducing medications.  It is very important to change and modify your diet to avoid further inflammation.  Avoid greasy, fatty, acidic foods and drinks.    Stop taking naproxen as this is a known medication that causes increased risk for ulcers and  stomach irritation.  Instead, you can take 1000 mg of acetaminophen (Tylenol) for pain for arthritis.  Take omeprazole 20 mg daily with breakfast.  Famotidine (pepcid) 20 mg twice a day with breakfast and dinner.  Use Carafate suspension 20 to 30 minutes before meals for the next 3 to 5 days.  Follow-up with gastroenterology in 1 to 2 weeks if symptoms persist and do not improve despite this regimen.  Return to the ER for worsening pain, blood in vomit, bloody stools, chest pain or shortness of breath with exertion or activity.

## 2017-10-16 NOTE — ED Notes (Signed)
Pt state he understands instructions. Home stable with girlfriend.

## 2017-10-16 NOTE — ED Notes (Signed)
spouse out to charge desk concerned that husband is having ongoing pain and raising her voice to staff. This charge RN went to discuss concerns at bedside in patient presence. Advised of resulted labs, treatment plan of U/S and informed primary RN and PA of patient ongoing discomfort. Pain to be addressed

## 2017-10-16 NOTE — ED Triage Notes (Addendum)
Patients presents to the ED with complaints of Abdominal pain x1 week. Patient reports pain is is generalized. Patient reports he saw PCP yesterday Dx with peptic ulcer. Patient reports medication was was working. Patient reports vomiting x2 in the last 24 hours.

## 2017-10-16 NOTE — ED Provider Notes (Signed)
[facility] EMERGENCY DEPARTMENT Provider Note   CSN: [id number] Arrival date & time: 10/16/17  0456     History   Chief Complaint Chief Complaint  Patient presents with  . Abdominal Pain    HPI Brandon Curtis is a 57 y.o. male here for evaluation of abdominal pain.  She is unable to describe the type of pain.  Pain is to the epigastric, right upper quadrant that radiates intermittently to his left lower chest and sternum.  Onset 4 to 5 days ago after eating.  Last night he was really hungry and had pizza, 30 to 45 minutes later pain suddenly worsened to a 10/10 and he had 2 episodes of nonbilious nonbloody emesis with a sour taste in the back of his mouth.  The pain has been constant since and has been intermittently worsening while in the ER.  He went to his PCP yesterday and was given omeprazole and sucralfate which she has taken once, thinks it may have helped temporarily.  He takes naproxen as needed.  Denies any other NSAID use, Goody powders.  Occasional EtOH use.  Aggravating factors include eating and palpation.  Alleviated slightly by sitting forward, omeprazole and sucralfate.  Nonexertional.  Nonpleuritic.  He denies fevers, chills, exertional chest pain or shortness of breath, back pain, melena, hematochezia, diarrhea.  No previous abdominal surgeries.  HPI  Past Medical History:  Diagnosis Date  . Arthritis     There are no active problems to display for this patient.   History reviewed. No pertinent surgical history.      Home Medications    Prior to Admission medications   Medication Sig Start Date End Date Taking? Authorizing Provider  bismuth subsalicylate (PEPTO BISMOL) 262 MG/15ML suspension Take 30 mLs by mouth every 6 (six) hours as needed.   Yes [provider]  predniSONE (DELTASONE) 50 MG tablet Take 1 tablet (50 mg total) by mouth daily for 5 days. 10/12/17 10/17/17 Yes Isa Rankin, MD  famotidine (PEPCID) 20 MG tablet  Take 1 tablet (20 mg total) by mouth 2 (two) times daily. 10/16/17   Liberty Handy, PA-C  famotidine (PEPCID) 20 MG tablet Take 1 tablet (20 mg total) by mouth 2 (two) times daily. 10/16/17   Liberty Handy, PA-C  omeprazole (PRILOSEC) 20 MG capsule Take 1 capsule (20 mg total) by mouth daily. 10/16/17   Liberty Handy, PA-C  omeprazole (PRILOSEC) 20 MG capsule Take 1 capsule (20 mg total) by mouth daily. 10/16/17   Liberty Handy, PA-C  sucralfate (CARAFATE) 1 GM/10ML suspension Take 10 mLs (1 g total) by mouth 4 (four) times daily -  with meals and at bedtime. 10/16/17   Liberty Handy, PA-C  sucralfate (CARAFATE) 1 GM/10ML suspension Take 10 mLs (1 g total) by mouth 4 (four) times daily -  with meals and at bedtime. 10/16/17   Liberty Handy, PA-C    Family History Family History  Problem Relation Age of Onset  . Diabetes Sister     Social History Social History   Tobacco Use  . Smoking status: Former Smoker    Types: Cigarettes    Last attempt to quit: 10/05/2014    Years since quitting: 3.0  . Smokeless tobacco: Never Used  Substance Use Topics  . Alcohol use: Yes    Alcohol/week: 1.2 oz    Types: 2 Standard drinks or equivalent per week    Comment: 2 drinks a week  . Drug  use: No     Allergies   Patient has no known allergies.   Review of Systems Review of Systems  Cardiovascular: Positive for chest pain.  Gastrointestinal: Positive for abdominal pain, nausea and vomiting.  All other systems reviewed and are negative.    Physical Exam Updated Vital Signs BP (!) 157/101   Pulse (!) 45   Temp 98.2 F (36.8 C) (Oral)   Resp 14   Ht 5\' 7"  (1.702 m)   Wt 71.2 kg (157 lb)   SpO2 99%   BMI 24.59 kg/m   Physical Exam  Constitutional: He is oriented to person, place, and time. He appears well-developed and well-nourished. No distress.  Non toxic.  HENT:  Head: Normocephalic and atraumatic.  Nose: Nose normal.  Moist mucous membranes   Eyes:  Pupils are equal, round, and reactive to light. Conjunctivae and EOM are normal.  Neck: Normal range of motion.  Cardiovascular: Normal rate, regular rhythm, normal heart sounds and intact distal pulses.  No murmur heard. 2+ DP and radial pulses bilaterally. No LE edema.   Pulmonary/Chest: Effort normal and breath sounds normal.  No reproducible chest wall tenderness  Abdominal: Soft. Bowel sounds are normal. There is tenderness in the right upper quadrant and epigastric area.  Mild guarding to epigastrium with deep palpation. No suprapubic or CVA tenderness. Negative Murphy's and McBurney's. No distention.   Musculoskeletal: Normal range of motion.  Neurological: He is alert and oriented to person, place, and time.  Skin: Skin is warm and dry. Capillary refill takes less than 2 seconds.  Psychiatric: He has a normal mood and affect. His behavior is normal. Judgment and thought content normal.  Nursing note and vitals reviewed.    ED Treatments / Results  Labs (all labs ordered are listed, but only abnormal results are displayed) Labs Reviewed  COMPREHENSIVE METABOLIC PANEL - Abnormal; Notable for the following components:      Result Value   Chloride 99 (*)    Glucose, Bld 122 (*)    All other components within normal limits  LIPASE, BLOOD  CBC  URINALYSIS, ROUTINE W REFLEX MICROSCOPIC  I-STAT TROPONIN, ED    EKG EKG Interpretation  Date/Time:  Saturday October 16 2017 08:12:03 EDT Ventricular Rate:  49 PR Interval:    QRS Duration: 86 QT Interval:  432 QTC Calculation: 390 R Axis:   53 Text Interpretation:  Sinus bradycardia Anterior infarct, old Minimal ST elevation, inferior leads Since last tracing rate slower Confirmed by [place], Doreatha Martin [phone]) on 10/16/2017 8:55:15 AM   Radiology Dg Cervical Spine Complete  Result Date: 10/15/2017 CLINICAL DATA:  Neck pain EXAM: CERVICAL SPINE - COMPLETE 4+ VIEW COMPARISON:  None. FINDINGS: Seven cervical segments are well  visualized. Vertebral body height is well maintained. Mild osteophytic changes are seen. Facet hypertrophic changes are noted as well. Mild neural foraminal narrowing is noted bilaterally at C4-5, C5-6 and C6-7. The odontoid is within normal limits. No soft tissue changes are seen. IMPRESSION: Multilevel degenerative change without acute abnormality. Electronically Signed   By: Alcide Clever M.D.   On: 10/15/2017 14:29   Dg Chest Portable 1 View  Result Date: 10/16/2017 CLINICAL DATA:  Chest pain and epigastric pain for 1 week. EXAM: PORTABLE CHEST 1 VIEW COMPARISON:  None. FINDINGS: Cardiac silhouette is normal size. No mediastinal or hilar masses or convincing adenopathy. Clear lungs.  No pleural effusion or pneumothorax. Skeletal structures are grossly intact. IMPRESSION: No active disease. Electronically Signed   By: Onalee Hua  Ormond M.D.   On: 10/16/2017 08:27   [place] Abdomen Limited Ruq  Result Date: 10/16/2017 CLINICAL DATA:  Right upper quadrant abdominal pain. EXAM: ULTRASOUND ABDOMEN LIMITED RIGHT UPPER QUADRANT COMPARISON:  None. FINDINGS: Gallbladder: No gallstones or wall thickening visualized. No sonographic Murphy sign noted by sonographer. Common bile duct: Diameter: 4 mm Liver: No focal lesion identified. Within normal limits in parenchymal echogenicity. Portal vein is patent on color Doppler imaging with normal direction of blood flow towards the liver. IMPRESSION: Normal right upper quadrant ultrasound. Electronically Signed   By: Amie Portlandavid  Ormond M.D.   On: 10/16/2017 10:40    Procedures Procedures (including critical care time)  Medications Ordered in ED Medications  ondansetron (ZOFRAN-ODT) disintegrating tablet 4 mg (4 mg Oral Given 10/16/17 0512)  gi cocktail (Maalox,Lidocaine,Donnatal) (30 mLs Oral Given 10/16/17 0908)  famotidine (PEPCID) tablet 20 mg (20 mg Oral Given 10/16/17 0831)  pantoprazole (PROTONIX) EC tablet 40 mg (40 mg Oral Given 10/16/17 0831)  ondansetron (ZOFRAN-ODT)  disintegrating tablet 4 mg (4 mg Oral Given 10/16/17 0832)  morphine 4 MG/ML injection 4 mg (4 mg Intravenous Given 10/16/17 1006)     Initial Impression / Assessment and Plan / ED Course  I have reviewed the triage vital signs and the nursing notes.  Pertinent labs & imaging results that were available during my care of the patient were reviewed by me and considered in my medical decision making (see chart for details).     Highest on ddx is gastritis, GERD, PUD, cholelithiasis.  Given age, tobacco use, radiation into chest ACS also considered.  He has no infectious symptoms, cough, chills to suggest pneumonia.  Abdomen without peritonitis, perforation is unlikely.   0820: CBC, CMP, lipase, urinalysis reviewed and unremarkable.  Pending troponin, EKG, chest x-ray and right upper quadrant ultrasound.   1130: Reevaluated patient after GI cocktail, Pepcid, Protonix and morphine, his pain has improved.  No episodes of emesis. Tolerating PO. His discomfort is mostly epigastric and left of sternum that has been constant since onset.  This continues to be nonexertional and nonpleuritic.  Lab work reviewed and reassuring.  Troponin 0 0.00.  CXR and EKG without abnormalities. Lipase, LFTs unremarkable.  Right upper quadrant ultrasound negative.  High suspicion for GI etiology over ACS. I do not think delta trop is indicated given symptoms and constant pain with flat troponin.  Will dc with PPI, pepcid, carafate and GI f/u.  Discussed return precautions including exertional CP, SOB, hematemesis, melena, hematochezia, worsening abd pain. Pt and partner at bedside in agreement. All concerns addressed.  Final Clinical Impressions(s) / ED Diagnoses   Final diagnoses:  Epigastric abdominal pain    ED Discharge Orders        Ordered    omeprazole (PRILOSEC) 20 MG capsule  Daily     10/16/17 1153    famotidine (PEPCID) 20 MG tablet  2 times daily     10/16/17 1153    sucralfate (CARAFATE) 1 GM/10ML  suspension  3 times daily with meals & bedtime     10/16/17 1153    famotidine (PEPCID) 20 MG tablet  2 times daily     10/16/17 1204    omeprazole (PRILOSEC) 20 MG capsule  Daily     10/16/17 1204    sucralfate (CARAFATE) 1 GM/10ML suspension  3 times daily with meals & bedtime     10/16/17 1204       Jerrell MylarGibbons, Dody Smartt J, PA-C 10/16/17 1952    Doug SouJacubowitz, Sam, MD  10/17/17 0715  

## 2017-10-17 LAB — H. PYLORI BREATH COLLECTION

## 2017-10-17 LAB — H. PYLORI BREATH TEST: H PYLORI BREATH TEST: POSITIVE — AB

## 2017-10-18 ENCOUNTER — Other Ambulatory Visit: Payer: Self-pay | Admitting: Urgent Care

## 2017-10-18 MED ORDER — CLARITHROMYCIN 500 MG PO TABS: 500.0000 mg | ORAL_TABLET | Freq: Two times a day (BID) | ORAL | 0 refills | Status: DC

## 2017-10-18 MED ORDER — AMOXICILLIN 500 MG PO CAPS: 1000.0000 mg | ORAL_CAPSULE | Freq: Two times a day (BID) | ORAL | 0 refills | Status: DC

## 2017-10-18 MED ORDER — OMEPRAZOLE 20 MG PO CPDR: 20.0000 mg | DELAYED_RELEASE_CAPSULE | Freq: Two times a day (BID) | ORAL | 0 refills | Status: DC

## 2017-10-21 ENCOUNTER — Telehealth: Payer: Self-pay | Admitting: Urgent Care

## 2017-10-21 DIAGNOSIS — Z1211 Encounter for screening for malignant neoplasm of colon: Secondary | ICD-10-CM

## 2017-10-21 DIAGNOSIS — R768 Other specified abnormal immunological findings in serum: Secondary | ICD-10-CM

## 2017-10-21 NOTE — Telephone Encounter (Signed)
Called pt to see what facility he wanted to go to for his colon cancer screening. Pt stated the facility that mani told him to call does not treat pt's with hep C and wants a referral to somewhere that treats hep C. Pt stated that he wants the colon cancer screen done as well but that is not as important at this time as the Hep C. Didn't see a referral in for Hep C. Thanks

## 2017-10-21 NOTE — Telephone Encounter (Signed)
Referrals placed. Thank you for your help with this!

## 2017-11-02 ENCOUNTER — Encounter: Payer: Self-pay | Admitting: Physician Assistant

## 2017-11-02 ENCOUNTER — Ambulatory Visit (INDEPENDENT_AMBULATORY_CARE_PROVIDER_SITE_OTHER): Payer: 59 | Admitting: Physician Assistant

## 2017-11-02 VITALS — BP 124/86 | HR 64 | Ht 67.0 in | Wt 162.0 lb

## 2017-11-02 DIAGNOSIS — Z1211 Encounter for screening for malignant neoplasm of colon: Secondary | ICD-10-CM | POA: Diagnosis not present

## 2017-11-02 DIAGNOSIS — R768 Other specified abnormal immunological findings in serum: Secondary | ICD-10-CM | POA: Diagnosis not present

## 2017-11-02 DIAGNOSIS — R1013 Epigastric pain: Secondary | ICD-10-CM

## 2017-11-02 MED ORDER — PEG 3350-KCL-NA BICARB-NACL 420 G PO SOLR: ORAL | 0 refills | Status: DC

## 2017-11-02 NOTE — Progress Notes (Signed)
Subjective:    Patient ID: Brandon Curtis, male    DOB: 1961/01/01, 57 y.o.   MRN: [id number]  HPI Corian is a pleasant 57 year old African-American male, new to GI today referred by Wallis Bamberg, PA-C for colon cancer screening, and also for evaluation of recent complaints of epigastric pain.  Patient was also found to be hep C antibody positive on recent screening labs and has an appointment tomorrow with infectious disease. Patient has not had any prior GI evaluation.  He did have abdominal ultrasound done on 10/16/2017 because of complaints of upper abdominal pain this was a normal exam. Recent labs including CBC and chemistries all within normal limits.  H. pylori breath testing was positive and he is completing a course of amoxicillin clarithromycin and omeprazole currently. He says when he was initially evaluated for the abdominal pain a few weeks ago he had been hurting in his upper abdomen for several days.  He thinks that he aggravated his stomach by eating a lot of hot spicy foods and drinking a lot of acidic drinks.  He had no nausea or vomiting.  No melena or hematochezia.  Bowel movements have been normal.  He has no regular complaints of heartburn or indigestion and no dysphagia or odynophagia. Says his abdominal pain has resolved since taking the above regimen. Denies any regular aspirin or NSAIDs. Family history is negative for colon cancer and polyps as far as he is aware.  Review of Systems Pertinent positive and negative review of systems were noted in the above HPI section.  All other review of systems was otherwise negative.  Outpatient Encounter Medications as of 11/02/2017  Medication Sig  . amoxicillin (AMOXIL) 500 MG capsule Take 2 capsules (1,000 mg total) by mouth 2 (two) times daily.  . clarithromycin (BIAXIN) 500 MG tablet Take 1 tablet (500 mg total) by mouth 2 (two) times daily.  Marland Kitchen omeprazole (PRILOSEC) 20 MG capsule Take 1 capsule (20 mg total) by mouth 2 (two) times  daily before a meal.  . sucralfate (CARAFATE) 1 GM/10ML suspension Take 10 mLs (1 g total) by mouth 4 (four) times daily -  with meals and at bedtime.  . polyethylene glycol-electrolytes (NULYTELY/GOLYTELY) 420 g solution Take as directed for colonoscopy prep.  . [DISCONTINUED] bismuth subsalicylate (PEPTO BISMOL) 262 MG/15ML suspension Take 30 mLs by mouth every 6 (six) hours as needed.  . [DISCONTINUED] sucralfate (CARAFATE) 1 GM/10ML suspension Take 10 mLs (1 g total) by mouth 4 (four) times daily -  with meals and at bedtime.   No facility-administered encounter medications on file as of 11/02/2017.    No Known Allergies There are no active problems to display for this patient.  Social History   Socioeconomic History  . Marital status: Single    Spouse name: Not on file  . Number of children: 1  . Years of education: Not on file  . Highest education level: Not on file  Occupational History  . Not on file  Social Needs  . Financial resource strain: Not on file  . Food insecurity:    Worry: Not on file    Inability: Not on file  . Transportation needs:    Medical: Not on file    Non-medical: Not on file  Tobacco Use  . Smoking status: Former Smoker    Types: Cigarettes    Last attempt to quit: 10/05/2014    Years since quitting: 3.0  . Smokeless tobacco: Never Used  Substance and Sexual Activity  .  Alcohol use: Yes    Alcohol/week: 1.2 oz    Types: 2 Standard drinks or equivalent per week    Comment: 2 drinks a week  . Drug use: No  . Sexual activity: Yes  Lifestyle  . Physical activity:    Days per week: Not on file    Minutes per session: Not on file  . Stress: Not on file  Relationships  . Social connections:    Talks on phone: Not on file    Gets together: Not on file    Attends religious service: Not on file    Active member of club or organization: Not on file    Attends meetings of clubs or organizations: Not on file    Relationship status: Not on file  .  Intimate partner violence:    Fear of current or ex partner: Not on file    Emotionally abused: Not on file    Physically abused: Not on file    Forced sexual activity: Not on file  Other Topics Concern  . Not on file  Social History Narrative  . Not on file    Mr. Rondel BatonMiller's family history includes Diabetes in his sister.      Objective:    Vitals:   11/02/17 0946  BP: 124/86  Pulse: 64    Physical Exam; well-developed African-American male in no acute distress, blood pressure 124/86 pulse 64, height 5 foot 7, weight 162, BMI 25.3.  HEENT ;nontraumatic normocephalic EOMI PERRLA sclera anicteric, oropharynx benign, Cardiovascular; regular rate and rhythm with S1-S2 no murmur rub or gallop, Pulmonary ;clear bilaterally, Abdomen ;soft, nontender nondistended bowel sounds are active there is no palpable mass or hepatosplenomegaly, Rectal ;exam not done, Extremities; no clubbing cyanosis or edema skin warm and dry, Neuro psych; alert and oriented, grossly nonfocal mood and affect appropriate       Assessment & Plan:   #591 57 year old African-American male referred for colon cancer screening-average risk and asymptomatic  #2 recent epigastric pain resolved-suspect H. pylori induced gastropathy, rule out peptic ulcer disease, rule out occult neoplasm. He is completing a course of amoxicillin clarithromycin and omeprazole and is feeling better.  No chronic GERD complaints.  #3 H. pylori antibody positive-quantitative markers have  not been done-patient has been referred to infectious disease and has upcoming appointment  Plan; Patient strongly advised to complete the entire course of antibiotics.  After completing antibiotics he can decrease omeprazole to 20 mg once daily every morning for another month and then discontinue He will be scheduled for upper endoscopy with Dr. Christella HartiganJacobs.  Procedure was discussed in detail with the patient including indications risks and benefits and he is  agreeable to proceed.  Patient will be scheduled for colonoscopy with Dr. Christella HartiganJacobs.  Procedure was discussed in detail with the patient including indications risks and benefits and he is agreeable to proceed.  He is advised to keep his appointment with infectious disease for management of possible active hepatitis C.  Karely Hurtado Oswald HillockS Teale Goodgame PA-C 11/02/2017   Cc: Wallis BambergMani, Mario, PA-C

## 2017-11-02 NOTE — Patient Instructions (Signed)
Finish the current antibiotics then take Omeprazole 20 mg twice daily for one more month.  You have been scheduled for an endoscopy and colonoscopy. Please follow the written instructions given to you at your visit today. Please pick up your prep supplies at the pharmacy within the next 1-3 days. If you use inhalers (even only as needed), please bring them with you on the day of your procedure. Your physician has requested that you go to www.startemmi.com and enter the access code given to you at your visit today. This web site gives a general overview about your procedure. However, you should still follow specific instructions given to you by our office regarding your preparation for the procedure.  If you are age 59 or younger, your body mass index should be between 19-25. Your Body mass index is 25.37 kg/m. If this is out of the aformentioned range listed, please consider follow up with your Primary Care Provider.

## 2017-11-03 ENCOUNTER — Encounter: Payer: Self-pay | Admitting: Internal Medicine

## 2017-11-03 ENCOUNTER — Ambulatory Visit (INDEPENDENT_AMBULATORY_CARE_PROVIDER_SITE_OTHER): Payer: 59 | Admitting: Internal Medicine

## 2017-11-03 DIAGNOSIS — R768 Other specified abnormal immunological findings in serum: Secondary | ICD-10-CM

## 2017-11-03 NOTE — Progress Notes (Signed)
I agree with the above note, plan 

## 2017-11-03 NOTE — Progress Notes (Signed)
[facility]   CC: consideration for treatment for chronic hepatitis C  HPI:  +Brandon Curtis is a 57 y.o. male who presents for initial evaluation and management of chronic hepatitis C.  Patient tested positive last month during routine care. Hepatitis C-associated risk factors present are: none. Patient denies history of blood transfusion, IV drug abuse, tattoos. Patient has not had other studies performed. Results: positive antibody only. Patient has not had prior treatment for Hepatitis C. Patient does not have a past history of liver disease. Patient does not have a family history of liver disease. Patient does not  have associated signs or symptoms related to liver disease.  Records reviewed from Epic and getting EGD, colonoscopy this Friday.  Had a recent ultrasound as well, no overt cirrhosis.  No elastography yet done.     Patient does not have documented immunity to Hepatitis A. Patient does not have documented immunity to Hepatitis B.    Review of Systems:  Constitutional: negative for fevers, chills, fatigue and malaise Gastrointestinal: negative for nausea and diarrhea Integument/breast: negative for rash Musculoskeletal: negative for myalgias and arthralgias All other systems reviewed and are negative       Past Medical History:  Diagnosis Date  . Arthritis     Prior to Admission medications   Medication Sig Start Date End Date Taking? Authorizing Provider  amoxicillin (AMOXIL) 500 MG capsule Take 2 capsules (1,000 mg total) by mouth 2 (two) times daily. 10/18/17  Yes Wallis BambergMani, Mario, PA-C  clarithromycin (BIAXIN) 500 MG tablet Take 1 tablet (500 mg total) by mouth 2 (two) times daily. 10/18/17  Yes Wallis BambergMani, Mario, PA-C  omeprazole (PRILOSEC) 20 MG capsule Take 1 capsule (20 mg total) by mouth 2 (two) times daily before a meal. 10/18/17  Yes Wallis BambergMani, Mario, PA-C  sucralfate (CARAFATE) 1 GM/10ML suspension Take 10 mLs (1 g total) by mouth 4 (four) times  daily -  with meals and at bedtime. 10/16/17  Yes Sharen HeckGibbons, Claudia J, PA-C  polyethylene glycol-electrolytes (NULYTELY/GOLYTELY) 420 g solution Take as directed for colonoscopy prep. Patient not taking: Reported on 11/03/2017 11/02/17   Esterwood, Amy S, PA-C    No Known Allergies  Social History   Tobacco Use  . Smoking status: Former Smoker    Types: Cigarettes    Last attempt to quit: 10/05/2014    Years since quitting: 3.0  . Smokeless tobacco: Never Used  Substance Use Topics  . Alcohol use: Yes    Alcohol/week: 1.2 oz    Types: 2 Standard drinks or equivalent per week    Comment: 2 drinks a week  . Drug use: No    Family History  Problem Relation Age of Onset  . Diabetes Sister      Objective:  Constitutional: in no apparent distress and alert,  Vitals:   11/03/17 0958  BP: 128/78  Pulse: 69  Temp: 97.6 F (36.4 C)   Eyes: anicteric Cardiovascular: Cor RRR Respiratory: CTA B; normal respiratory effort Gastrointestinal: Bowel sounds are normal, liver is not enlarged, spleen is not enlarged Musculoskeletal: no pedal edema noted Skin: negatives: no rash; no porphyria cutanea tarda Lymphatic: no cervical lymphadenopathy   Laboratory Genotype: No results found for: HCVGENOTYPE HCV viral load: No results found for: HCVQUANT Lab Results  Component Value Date   WBC 6.7 10/16/2017   HGB 14.0 10/16/2017   HCT 42.2 10/16/2017   MCV 89.0 10/16/2017   PLT 216 10/16/2017    Lab Results  Component Value Date   CREATININE 1.00 10/16/2017   BUN 15 10/16/2017   NA 137 10/16/2017   K 3.8 10/16/2017   CL 99 (L) 10/16/2017   CO2 28 10/16/2017    Lab Results  Component Value Date   ALT 41 10/16/2017   AST 36 10/16/2017   ALKPHOS 51 10/16/2017     Labs and history reviewed and show CHILD-PUGH unknown  5-6 points: Child class A 7-9 points: Child class B 10-15 points: Child class C  Lab Results  Component Value Date   BILITOT 0.7 10/16/2017   ALBUMIN 4.4  10/16/2017     Assessment: New Patient with Chronic Hepatitis C genotype unknown, untreated.  I discussed with the patient the lab findings that confirm chronic hepatitis C as well as the natural history and progression of disease including about 30% of people who develop cirrhosis of the liver if left untreated and once cirrhosis is established there is a 2-7% risk per year of liver cancer and liver failure.  I discussed the importance of treatment and benefits in reducing the risk, even if significant liver fibrosis exists.   Plan: 1) Patient counseled extensively on limiting acetaminophen to no more than 2 grams daily, avoidance of alcohol. 2) Transmission discussed with patient including sexual transmission, sharing razors and toothbrush.   3) Will need referral to gastroenterology if concern for cirrhosis 4) Will need referral for substance abuse counseling: No.; Further work up to include urine drug screen  No. Will check his RNA and if positive, will be scheduled for further labs and elastography.  If negative, he will be notified and no follow up indicated.

## 2017-11-04 ENCOUNTER — Telehealth: Payer: Self-pay | Admitting: *Deleted

## 2017-11-04 NOTE — Telephone Encounter (Signed)
Called the patient to advise that per Mike GipAmy Esterwood PA, Since the insurance company is not wanting to approve the EGD, we will do just the Colonoscopy.  He can start to drink his prep at 5:00 PM, which he verbally stated he understands.  He asked if the insurance company approves the other can we do it ? I advised I was told to cancel it for now.  He is 3 months away from being [id number]. It seems [employer] doeesn't want to cover the EGD due to his age, not veing [id number] yet.

## 2017-11-05 ENCOUNTER — Encounter: Payer: Self-pay | Admitting: Gastroenterology

## 2017-11-05 ENCOUNTER — Encounter: Payer: 59 | Admitting: Gastroenterology

## 2017-11-05 ENCOUNTER — Ambulatory Visit (AMBULATORY_SURGERY_CENTER): Payer: 59 | Admitting: Gastroenterology

## 2017-11-05 ENCOUNTER — Other Ambulatory Visit: Payer: Self-pay

## 2017-11-05 VITALS — BP 120/84 | HR 61 | Temp 98.6°F | Resp 16 | Ht 67.0 in | Wt 162.0 lb

## 2017-11-05 DIAGNOSIS — K2970 Gastritis, unspecified, without bleeding: Secondary | ICD-10-CM

## 2017-11-05 DIAGNOSIS — K2990 Gastroduodenitis, unspecified, without bleeding: Secondary | ICD-10-CM | POA: Diagnosis not present

## 2017-11-05 DIAGNOSIS — K5730 Diverticulosis of large intestine without perforation or abscess without bleeding: Secondary | ICD-10-CM

## 2017-11-05 DIAGNOSIS — Z1211 Encounter for screening for malignant neoplasm of colon: Secondary | ICD-10-CM | POA: Diagnosis not present

## 2017-11-05 DIAGNOSIS — R1013 Epigastric pain: Secondary | ICD-10-CM

## 2017-11-05 MED ORDER — SODIUM CHLORIDE 0.9 % IV SOLN: 500.0000 mL | Freq: Once | INTRAVENOUS | Status: DC

## 2017-11-05 NOTE — Patient Instructions (Signed)
Information on gastritis and diverticulosis given to you today   YOU HAD AN ENDOSCOPIC PROCEDURE TODAY AT THE [facility]:   Refer to the procedure report that was given to you for any specific questions about what was found during the examination.  If the procedure report does not answer your questions, please call your gastroenterologist to clarify.  If you requested that your care partner not be given the details of your procedure findings, then the procedure report has been included in a sealed envelope for you to review at your convenience later.  YOU SHOULD EXPECT: Some feelings of bloating in the abdomen. Passage of more gas than usual.  Walking can help get rid of the air that was put into your GI tract during the procedure and reduce the bloating. If you had a lower endoscopy (such as a colonoscopy or flexible sigmoidoscopy) you may notice spotting of blood in your stool or on the toilet paper. If you underwent a bowel prep for your procedure, you may not have a normal bowel movement for a few days.  Please Note:  You might notice some irritation and congestion in your nose or some drainage.  This is from the oxygen used during your procedure.  There is no need for concern and it should clear up in a day or so.  SYMPTOMS TO REPORT IMMEDIATELY:   Following lower endoscopy (colonoscopy or flexible sigmoidoscopy):  Excessive amounts of blood in the stool  Significant tenderness or worsening of abdominal pains  Swelling of the abdomen that is new, acute  Fever of 100F or higher   Following upper endoscopy (EGD)  Vomiting of blood or coffee ground material  New chest pain or pain under the shoulder blades  Painful or persistently difficult swallowing  New shortness of breath  Fever of 100F or higher  Black, tarry-looking stools  For urgent or emergent issues, a gastroenterologist can be reached at any hour by calling [phone].   DIET:  We do recommend a  small meal at first, but then you may proceed to your regular diet.  Drink plenty of fluids but you should avoid alcoholic beverages for 24 hours.  ACTIVITY:  You should plan to take it easy for the rest of today and you should NOT DRIVE or use heavy machinery until tomorrow (because of the sedation medicines used during the test).    FOLLOW UP: Our staff will call the number listed on your records the next business day following your procedure to check on you and address any questions or concerns that you may have regarding the information given to you following your procedure. If we do not reach you, we will leave a message.  However, if you are feeling well and you are not experiencing any problems, there is no need to return our call.  We will assume that you have returned to your regular daily activities without incident.  If any biopsies were taken you will be contacted by phone or by letter within the next 1-3 weeks.  Please call us at [phone] if you have not heard about the biopsies in 3 weeks.    SIGNATURES/CONFIDENTIALITY: You and/or your care partner have signed paperwork which will be entered into your electronic medical record.  These signatures attest to the fact that that the information above on your After Visit Summary has been reviewed and is understood.  Full responsibility of the confidentiality of this discharge information lies with you and/or your  care-partner.

## 2017-11-05 NOTE — Op Note (Signed)
[facility] Patient Name: Brandon RidgeRonald Tang Procedure Date: 11/05/2017 1:50 PM MRN: [id number] Endoscopist: Rachael Feeaniel P Jacobs , MD Age: 57 Referring MD:  Date of Birth: 02/11/1961 Gender: Male Account #: [id number] Procedure:                Colonoscopy Indications:              Screening for colorectal malignant neoplasm Medicines:                Monitored Anesthesia Care Procedure:                Pre-Anesthesia Assessment:                           - Prior to the procedure, a History and Physical                            was performed, and patient medications and                            allergies were reviewed. The patient's tolerance of                            previous anesthesia was also reviewed. The risks                            and benefits of the procedure and the sedation                            options and risks were discussed with the patient.                            All questions were answered, and informed consent                            was obtained. Prior Anticoagulants: The patient has                            taken no previous anticoagulant or antiplatelet                            agents. ASA Grade Assessment: II - A patient with                            mild systemic disease. After reviewing the risks                            and benefits, the patient was deemed in                            satisfactory condition to undergo the procedure.                           After obtaining informed consent, the colonoscope  was passed under direct vision. Throughout the                            procedure, the patient's blood pressure, pulse, and                            oxygen saturations were monitored continuously. The                            Colonoscope was introduced through the anus and                            advanced to the the cecum, identified by                            appendiceal orifice and  ileocecal valve. The                            colonoscopy was performed without difficulty. The                            patient tolerated the procedure well. The quality                            of the bowel preparation was good. The ileocecal                            valve, appendiceal orifice, and rectum were                            photographed. Scope In: 1:54:06 PM Scope Out: 2:04:20 PM Scope Withdrawal Time: 0 hours 7 minutes 5 seconds  Total Procedure Duration: 0 hours 10 minutes 14 seconds  Findings:                 Multiple small and large-mouthed diverticula were                            found in the left colon.                           The exam was otherwise without abnormality on                            direct and retroflexion views. Complications:            No immediate complications. Estimated blood loss:                            None. Estimated Blood Loss:     Estimated blood loss: none. Impression:               - Diverticulosis in the left colon.                           - The examination was otherwise normal on direct  and retroflexion views.                           - No polyps or cancers. Recommendation:           - Patient has a contact number available for                            emergencies. The signs and symptoms of potential                            delayed complications were discussed with the                            patient. Return to normal activities tomorrow.                            Written discharge instructions were provided to the                            patient.                           - Resume previous diet.                           - Continue present medications.                           - Repeat colonoscopy in 10 years for screening. Rachael Fee, MD 11/05/2017 2:06:01 PM This report has been signed electronically.

## 2017-11-05 NOTE — Op Note (Signed)
[facility] Patient Name: Brandon Curtis Procedure Date: 11/05/2017 1:50 PM MRN: [id number] Endoscopist: Rachael Fee , MD Age: 57 Referring MD:  Date of Birth: 02-20-1961 Gender: Male Account #: [id number] Procedure:                Upper GI endoscopy Indications:              Epigastric abdominal pain, recent H pylori breath                            test +, currently on abx Medicines:                Monitored Anesthesia Care Procedure:                Pre-Anesthesia Assessment:                           - Prior to the procedure, a History and Physical                            was performed, and patient medications and                            allergies were reviewed. The patient's tolerance of                            previous anesthesia was also reviewed. The risks                            and benefits of the procedure and the sedation                            options and risks were discussed with the patient.                            All questions were answered, and informed consent                            was obtained. Prior Anticoagulants: The patient has                            taken no previous anticoagulant or antiplatelet                            agents. ASA Grade Assessment: II - A patient with                            mild systemic disease. After reviewing the risks                            and benefits, the patient was deemed in                            satisfactory condition to undergo the procedure.  After obtaining informed consent, the endoscope was                            passed under direct vision. Throughout the                            procedure, the patient's blood pressure, pulse, and                            oxygen saturations were monitored continuously. The                            Endoscope was introduced through the mouth, and                            advanced to the second part of  duodenum. The upper                            GI endoscopy was accomplished without difficulty.                            The patient tolerated the procedure well. Scope In: Scope Out: Findings:                 Mild, non-specific gastritis distally.                           Mild duodenitis.                           The exam was otherwise without abnormality. Complications:            No immediate complications. Estimated blood loss:                            None. Estimated Blood Loss:     Estimated blood loss: none. Impression:               - Gastritis and duodenitis (likely from recently                            proven H. pylori)                           - The examination was otherwise normal.                           - No specimens collected. Recommendation:           - Patient has a contact number available for                            emergencies. The signs and symptoms of potential                            delayed complications were discussed with the  patient. Return to normal activities tomorrow.                            Written discharge instructions were provided to the                            patient.                           - Resume previous diet.                           - Continue present medications. Please complete                            your H. pylori antibiotics.                           - Office visit with Mike Gip or Dr. Christella Hartigan in                            6-7 weeks, check for resolution of symptoms and                            test for H. pylori eradication. Rachael Fee, MD 11/05/2017 2:13:55 PM This report has been signed electronically.

## 2017-11-05 NOTE — Progress Notes (Signed)
Report to PACU, RN, vss, BBS= Clear.  

## 2017-11-06 ENCOUNTER — Other Ambulatory Visit: Payer: Self-pay | Admitting: Internal Medicine

## 2017-11-06 DIAGNOSIS — B182 Chronic viral hepatitis C: Secondary | ICD-10-CM | POA: Insufficient documentation

## 2017-11-06 LAB — HEPATITIS C RNA QUANTITATIVE
HCV QUANT LOG: 5.83 {Log_IU}/mL — AB
HCV RNA, PCR, QN: 673000 IU/mL — ABNORMAL HIGH

## 2017-11-07 LAB — HEPATITIS C GENOTYPE

## 2017-11-08 ENCOUNTER — Telehealth: Payer: Self-pay

## 2017-11-08 NOTE — Telephone Encounter (Signed)
  Follow up Call-  Call back number 11/05/2017  Post procedure Call Back phone  # [phone]  Permission to leave phone message Yes  Some recent data might be hidden     Patient questions:  Do you have a fever, pain , or abdominal swelling? No. Pain Score  0 *  Have you tolerated food without any problems? Yes.    Have you been able to return to your normal activities? Yes.    Do you have any questions about your discharge instructions: Diet   No. Medications  No. Follow up visit  No.  Do you have questions or concerns about your Care? No.  Actions: * If pain score is 4 or above: No action needed, pain <4.

## 2017-11-16 ENCOUNTER — Telehealth: Payer: Self-pay | Admitting: *Deleted

## 2017-11-16 NOTE — Telephone Encounter (Signed)
Fantastic - I scheduled him to follow up with Dr Luciana Axeomer 7/8 10:30, confirmed with him today. Ismahan Lippman ===View-only below this line===  ----- Message ----- From: Ronnell Freshwaterehner, Ashley M Sent: 11/11/2017   4:02 PM To: Andree CossMichelle M Syanne Looney, RN Subject: RE: appointments for elastography, labs        Friday July 5th at 8:00 am [facility] [place] nothing to eat or drink after midnight.   Percell Millerehner, Ashley M  Aleph Nickson M, RN        Just got authorization. I will call to schedule.     ----- Message ----- From: Andree CossHowell, Charisma Charlot M, RN Sent: 11/11/2017   2:37 PM To: Ronnell FreshwaterAshley M Rehner Subject: appointments for elastography, labs            Morrie SheldonAshley, can you see if he needs prior authorization for the ordered elastography?  We need to go ahead and get this set up so we can get lab/follow up appointments. Thank you! Marcelino DusterMichelle ----- Message ----- From: Gardiner Barefootomer, Robert W, MD Sent: 11/06/2017   1:19 PM To: [facility] Triage Nurse Pool  He now has confirmed chronic hepatitis C with positive RNA.  He needs to come in for labs and get elastography scheduled and then follow up with me to discuss the results and treatment options.   thanks

## 2017-11-19 ENCOUNTER — Ambulatory Visit (HOSPITAL_COMMUNITY): Payer: 59 | Attending: Internal Medicine

## 2017-11-22 ENCOUNTER — Ambulatory Visit (INDEPENDENT_AMBULATORY_CARE_PROVIDER_SITE_OTHER): Payer: 59 | Admitting: Internal Medicine

## 2017-11-22 ENCOUNTER — Encounter: Payer: Self-pay | Admitting: Internal Medicine

## 2017-11-22 VITALS — BP 138/93 | HR 64 | Temp 98.1°F | Ht 69.0 in | Wt 166.0 lb

## 2017-11-22 DIAGNOSIS — B182 Chronic viral hepatitis C: Secondary | ICD-10-CM

## 2017-11-22 DIAGNOSIS — Z7185 Encounter for immunization safety counseling: Secondary | ICD-10-CM | POA: Insufficient documentation

## 2017-11-22 DIAGNOSIS — Z7189 Other specified counseling: Secondary | ICD-10-CM | POA: Insufficient documentation

## 2017-11-22 NOTE — Assessment & Plan Note (Signed)
Now confirmed positive.  Will finish labs today, schedule for elastography.  Will prescribe Harvoni once us/elastography done, if no masses.  Will schedule follow up after medication approved.

## 2017-11-22 NOTE — Progress Notes (Signed)
   Subjective:    Patient ID: Brandon BienenstockRonald E Tollett, male    DOB: 03/13/1961, 57 y.o.   MRN: [id number]  HPI Here for follow up of chronic hepatitis C. He was seen as a new patient with a positive hepatitis C antibody and I did confirmatory labs with a positive RNA and genotype 1a.  He was scheduled for elastography and labs but has not yet had them done.  No new issues.  Had a recent EGD by Dr. Christella HartiganJacobs.  Some inflammation noted c/w known H pylori.  Now off of ppi, antibiotics.  Ready for treatment.     Review of Systems  Constitutional: Negative for unexpected weight change.  Gastrointestinal: Negative for abdominal pain.  Skin: Negative for rash.       Objective:   Physical Exam  Constitutional: He appears well-developed and well-nourished. No distress.  HENT:  Mouth/Throat: No oropharyngeal exudate.  Eyes: No scleral icterus.  Cardiovascular: Normal rate, regular rhythm and normal heart sounds.  No murmur heard. Pulmonary/Chest: Effort normal and breath sounds normal. No respiratory distress.  Skin: No rash noted.   SH: no current tobacco       Assessment & Plan:

## 2017-11-22 NOTE — Assessment & Plan Note (Signed)
Counseled on checking hepatitis A and B and will vaccine as appropriate.

## 2017-11-23 LAB — CBC WITH DIFFERENTIAL/PLATELET
BASOS ABS: 29 {cells}/uL (ref 0–200)
BASOS PCT: 0.6 %
EOS ABS: 82 {cells}/uL (ref 15–500)
Eosinophils Relative: 1.7 %
HCT: 39.1 % (ref 38.5–50.0)
Hemoglobin: 13.2 g/dL (ref 13.2–17.1)
Lymphs Abs: 1661 cells/uL (ref 850–3900)
MCH: 30.2 pg (ref 27.0–33.0)
MCHC: 33.8 g/dL (ref 32.0–36.0)
MCV: 89.5 fL (ref 80.0–100.0)
MONOS PCT: 11.7 %
MPV: 11.2 fL (ref 7.5–12.5)
Neutro Abs: 2467 cells/uL (ref 1500–7800)
Neutrophils Relative %: 51.4 %
Platelets: 138 10*3/uL — ABNORMAL LOW (ref 140–400)
RBC: 4.37 10*6/uL (ref 4.20–5.80)
RDW: 12.5 % (ref 11.0–15.0)
Total Lymphocyte: 34.6 %
WBC: 4.8 10*3/uL (ref 3.8–10.8)
WBCMIX: 562 {cells}/uL (ref 200–950)

## 2017-11-23 LAB — HIV ANTIBODY (ROUTINE TESTING W REFLEX): HIV 1&2 Ab, 4th Generation: NONREACTIVE

## 2017-11-23 LAB — COMPLETE METABOLIC PANEL WITH GFR
AG Ratio: 1.6 (calc) (ref 1.0–2.5)
ALT: 47 U/L — ABNORMAL HIGH (ref 9–46)
AST: 38 U/L — ABNORMAL HIGH (ref 10–35)
Albumin: 4.5 g/dL (ref 3.6–5.1)
Alkaline phosphatase (APISO): 56 U/L (ref 40–115)
BUN: 13 mg/dL (ref 7–25)
CO2: 28 mmol/L (ref 20–32)
Calcium: 9.6 mg/dL (ref 8.6–10.3)
Chloride: 105 mmol/L (ref 98–110)
Creat: 1.05 mg/dL (ref 0.70–1.33)
GFR, Est African American: 90 mL/min/{1.73_m2} (ref >=60)
GFR, Est Non African American: 78 mL/min/{1.73_m2} (ref >=60)
Globulin: 2.8 g/dL (calc) (ref 1.9–3.7)
Glucose, Bld: 90 mg/dL (ref 65–99)
Potassium: 4.6 mmol/L (ref 3.5–5.3)
Sodium: 142 mmol/L (ref 135–146)
Total Bilirubin: 0.7 mg/dL (ref 0.2–1.2)
Total Protein: 7.3 g/dL (ref 6.1–8.1)

## 2017-11-23 LAB — HEPATITIS B SURFACE ANTIBODY,QUALITATIVE: Hep B S Ab: NONREACTIVE

## 2017-11-23 LAB — HEPATITIS B SURFACE ANTIGEN: Hepatitis B Surface Ag: NONREACTIVE

## 2017-11-23 LAB — HEPATITIS B CORE ANTIBODY, TOTAL: Hep B Core Total Ab: NONREACTIVE

## 2017-11-23 LAB — PROTIME-INR
INR: 1
Prothrombin Time: 10.4 s (ref 9.0–11.5)

## 2017-11-23 LAB — HEPATITIS A ANTIBODY, TOTAL: Hepatitis A AB,Total: NONREACTIVE

## 2017-11-25 LAB — LIVER FIBROSIS, FIBROTEST-ACTITEST
ALT: 44 U/L (ref 9–46)
APOLIPOPROTEIN A1: 175 mg/dL (ref 94–176)
Alpha-2-Macroglobulin: 314 mg/dL — ABNORMAL HIGH (ref 106–279)
Bilirubin: 0.4 mg/dL (ref 0.2–1.2)
Fibrosis Score: 0.49
GGT: 38 U/L (ref 3–85)
Haptoglobin: 56 mg/dL (ref 43–212)
NECROINFLAMMAT ACT SCORE: 0.3
Reference ID: 2544230

## 2017-11-26 ENCOUNTER — Ambulatory Visit (HOSPITAL_COMMUNITY)
Admission: RE | Admit: 2017-11-26 | Discharge: 2017-11-26 | Disposition: A | Discharge status: Home / Self Care | Payer: 59 | Source: Ambulatory Visit | Attending: Internal Medicine | Admitting: Internal Medicine

## 2017-11-26 ENCOUNTER — Other Ambulatory Visit: Payer: Self-pay | Admitting: Internal Medicine

## 2017-11-26 DIAGNOSIS — B182 Chronic viral hepatitis C: Secondary | ICD-10-CM | POA: Diagnosis not present

## 2017-11-26 MED ORDER — LEDIPASVIR-SOFOSBUVIR 90-400 MG PO TABS: 1.0000 | ORAL_TABLET | Freq: Every day | ORAL | 2 refills | Status: DC

## 2017-12-01 ENCOUNTER — Ambulatory Visit: Payer: 59 | Admitting: Pharmacy Technician

## 2017-12-01 MED FILL — HARVONI 90-400 MG TABLET: 90-400 | 28 days supply | Qty: 28 | Fill #0

## 2017-12-02 ENCOUNTER — Ambulatory Visit: Payer: 59 | Admitting: Pharmacy Technician

## 2017-12-02 ENCOUNTER — Encounter: Payer: Self-pay | Admitting: Pharmacy Technician

## 2017-12-02 ENCOUNTER — Telehealth: Payer: Self-pay | Admitting: Pharmacist Clinician (PhC)/ Clinical Pharmacy Specialist

## 2017-12-02 ENCOUNTER — Other Ambulatory Visit: Payer: Self-pay | Admitting: Pharmacist Clinician (PhC)/ Clinical Pharmacy Specialist

## 2017-12-02 NOTE — Telephone Encounter (Signed)
[name] is here today to pick up his Harvoni. He is supposed to be off of prilosec now after completing his H pylori treatment but he is still taking it. Told him to stop it today. [facility] and f/u with pharmacy in a Month for labs and picking up meds.

## 2017-12-28 MED FILL — HARVONI 90-400 MG TABLET: 90-400 | 28 days supply | Qty: 28 | Fill #1

## 2017-12-31 ENCOUNTER — Ambulatory Visit (INDEPENDENT_AMBULATORY_CARE_PROVIDER_SITE_OTHER): Payer: 59 | Admitting: Pharmacist

## 2017-12-31 DIAGNOSIS — B182 Chronic viral hepatitis C: Secondary | ICD-10-CM

## 2017-12-31 NOTE — Progress Notes (Signed)
HPI: Brandon BienenstockRonald E Curtis is a 57 y.o. male who [facility] for Hep C follow-up.  Hepatitis C Genotype: 1a  Fibrosis Score: F0/F1  Hepatitis C RNA: 673,000  Patient Active Problem List   Diagnosis Date Noted  . Vaccine counseling 11/22/2017  . Chronic hepatitis C without hepatic coma (HCC) 11/06/2017    Patient's Medications  New Prescriptions   No medications on file  Previous Medications   LEDIPASVIR-SOFOSBUVIR (HARVONI) 90-400 MG TABS    Take 1 tablet by mouth daily.   POLYETHYLENE GLYCOL-ELECTROLYTES (NULYTELY/GOLYTELY) 420 G SOLUTION    Take as directed for colonoscopy prep.   SUCRALFATE (CARAFATE) 1 GM/10ML SUSPENSION    Take 10 mLs (1 g total) by mouth 4 (four) times daily -  with meals and at bedtime.  Modified Medications   No medications on file  Discontinued Medications   No medications on file    Allergies: No Known Allergies  Past Medical History: Past Medical History:  Diagnosis Date  . Arthritis   . GERD (gastroesophageal reflux disease)     Social History: Social History   Socioeconomic History  . Marital status: Single    Spouse name: Not on file  . Number of children: 1  . Years of education: Not on file  . Highest education level: Not on file  Occupational History  . Not on file  Social Needs  . Financial resource strain: Not on file  . Food insecurity:    Worry: Not on file    Inability: Not on file  . Transportation needs:    Medical: Not on file    Non-medical: Not on file  Tobacco Use  . Smoking status: Former Smoker    Types: Cigarettes    Last attempt to quit: 10/05/2014    Years since quitting: 3.2  . Smokeless tobacco: Never Used  Substance and Sexual Activity  . Alcohol use: Not Currently    Alcohol/week: 2.0 standard drinks    Types: 2 Standard drinks or equivalent per week    Comment: 2 drinks a week  . Drug use: No  . Sexual activity: Yes  Lifestyle  . Physical activity:    Days per week: Not  on file    Minutes per session: Not on file  . Stress: Not on file  Relationships  . Social connections:    Talks on phone: Not on file    Gets together: Not on file    Attends religious service: Not on file    Active member of club or organization: Not on file    Attends meetings of clubs or organizations: Not on file    Relationship status: Not on file  Other Topics Concern  . Not on file  Social History Narrative  . Not on file    Labs: Hepatitis C Lab Results  Component Value Date   HCVGENOTYPE 1a 11/03/2017   HCVRNAPCRQN 673,000 (H) 11/03/2017   FIBROSTAGE F2 11/22/2017   Hepatitis B Lab Results  Component Value Date   HEPBSAB NON-REACTIVE 11/22/2017   HEPBSAG NON-REACTIVE 11/22/2017   HEPBCAB NON-REACTIVE 11/22/2017   Hepatitis A Lab Results  Component Value Date   HAV NON-REACTIVE 11/22/2017   HIV Lab Results  Component Value Date   HIV NON-REACTIVE 11/22/2017   HIV Non Reactive 10/15/2017   Lab Results  Component Value Date   CREATININE 1.05 11/22/2017   CREATININE 1.00 10/16/2017   CREATININE 1.15 10/15/2017   CREATININE 1.02 10/11/2015   CREATININE  0.94 11/04/2014   Lab Results  Component Value Date   AST 38 (H) 11/22/2017   AST 36 10/16/2017   AST 36 10/15/2017   ALT 44 11/22/2017   ALT 47 (H) 11/22/2017   ALT 41 10/16/2017   INR 1.0 11/22/2017    Assessment: Brandon Curtis is here today to follow-up for his Hep C infection.  He started on Harvoni x 12 weeks back on 7/17.  He did miss one dose where he just forgot to take it.  Encouraged him not to miss any more doses and explained that his chance of cure can go down with the more doses he misses.  He is having no side effects at all to the medication.  Will check a Hep C viral load today.  He picked up his 2nd month here as well.  Will put in a f/u appt to pick up his 3rd month and for his end of therapy appointment with me.   Plan: - Continue Harvoni x 12 weeks - Hep C viral load today - F/u to  pick up last month 9/13 at 10am - F/u with me 10/25 at 10am  Zigmund Linse L. Shan Padgett, PharmD, AAHIVP, CPP Infectious Diseases Clinical Pharmacist [facility] 12/31/2017, 10:52 AM

## 2018-01-04 LAB — HEPATITIS C RNA QUANTITATIVE
HCV QUANT LOG: NOT DETECTED {Log_IU}/mL
HCV RNA, PCR, QN: NOT DETECTED [IU]/mL

## 2018-01-19 ENCOUNTER — Ambulatory Visit: Payer: 59 | Admitting: Gastroenterology

## 2018-01-20 MED FILL — HARVONI 90-400 MG TABLET: 90-400 | 28 days supply | Qty: 28 | Fill #2

## 2018-01-28 ENCOUNTER — Ambulatory Visit: Payer: 59

## 2018-01-28 ENCOUNTER — Other Ambulatory Visit: Payer: 59

## 2018-01-28 ENCOUNTER — Ambulatory Visit (INDEPENDENT_AMBULATORY_CARE_PROVIDER_SITE_OTHER): Payer: 59 | Admitting: Physician Assistant

## 2018-01-28 ENCOUNTER — Encounter: Payer: Self-pay | Admitting: Physician Assistant

## 2018-01-28 VITALS — BP 128/76 | HR 84 | Ht 69.0 in | Wt 166.0 lb

## 2018-01-28 DIAGNOSIS — Z8619 Personal history of other infectious and parasitic diseases: Secondary | ICD-10-CM | POA: Diagnosis not present
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
## 2018-01-28 NOTE — Progress Notes (Signed)
I agree with the above note, plan 

## 2018-01-28 NOTE — Patient Instructions (Signed)
Your provider has requested that you go to the basement level for lab work before leaving today. Press "B" on the elevator. The lab is located at the first door on the left as you exit the elevator.  

## 2018-01-31 LAB — HELICOBACTER PYLORI  SPECIAL ANTIGEN
MICRO NUMBER: 91100418
SPECIMEN QUALITY: ADEQUATE

## 2018-03-11 ENCOUNTER — Ambulatory Visit (INDEPENDENT_AMBULATORY_CARE_PROVIDER_SITE_OTHER): Payer: BLUE CROSS/BLUE SHIELD | Admitting: Pharmacist

## 2018-03-11 DIAGNOSIS — Z23 Encounter for immunization: Secondary | ICD-10-CM

## 2018-03-11 DIAGNOSIS — B182 Chronic viral hepatitis C: Secondary | ICD-10-CM

## 2018-03-11 NOTE — Progress Notes (Signed)
HPI: Brandon Curtis is a 57 y.o. male who presents to the [facility] pharmacy clinic for Hep C follow-up.  Medication: Harvoni x 12 wks  Start Date: 12/03/17  Hepatitis C Genotype: 1a  Fibrosis Score: F0/F1  Hepatitis C RNA: 673,000 on 6/19, <15 on 8/16  Patient Active Problem List   Diagnosis Date Noted  . Vaccine counseling 11/22/2017  . Chronic hepatitis C without hepatic coma (HCC) 11/06/2017    Patient's Medications  New Prescriptions   No medications on file  Previous Medications   LEDIPASVIR-SOFOSBUVIR (HARVONI) 90-400 MG TABS    Take 1 tablet by mouth daily.  Modified Medications   No medications on file  Discontinued Medications   No medications on file    Allergies: No Known Allergies  Past Medical History: Past Medical History:  Diagnosis Date  . Arthritis   . GERD (gastroesophageal reflux disease)     Social History: Social History   Socioeconomic History  . Marital status: Single    Spouse name: Not on file  . Number of children: 1  . Years of education: Not on file  . Highest education level: Not on file  Occupational History  . Not on file  Social Needs  . Financial resource strain: Not on file  . Food insecurity:    Worry: Not on file    Inability: Not on file  . Transportation needs:    Medical: Not on file    Non-medical: Not on file  Tobacco Use  . Smoking status: Former Smoker    Types: Cigarettes    Last attempt to quit: 10/05/2014    Years since quitting: 3.4  . Smokeless tobacco: Never Used  Substance and Sexual Activity  . Alcohol use: Not Currently    Alcohol/week: 2.0 standard drinks    Types: 2 Standard drinks or equivalent per week    Comment: 2 drinks a week  . Drug use: No  . Sexual activity: Yes  Lifestyle  . Physical activity:    Days per week: Not on file    Minutes per session: Not on file  . Stress: Not on file  Relationships  . Social connections:    Talks on phone: Not on file    Gets together: Not on  file    Attends religious service: Not on file    Active member of club or organization: Not on file    Attends meetings of clubs or organizations: Not on file    Relationship status: Not on file  Other Topics Concern  . Not on file  Social History Narrative  . Not on file    Labs: Hepatitis C Lab Results  Component Value Date   HCVGENOTYPE 1a 11/03/2017   HCVRNAPCRQN <15 NOT DETECTED 12/31/2017   HCVRNAPCRQN 673,000 (H) 11/03/2017   FIBROSTAGE F2 11/22/2017   Hepatitis B Lab Results  Component Value Date   HEPBSAB NON-REACTIVE 11/22/2017   HEPBSAG NON-REACTIVE 11/22/2017   HEPBCAB NON-REACTIVE 11/22/2017   Hepatitis A Lab Results  Component Value Date   HAV NON-REACTIVE 11/22/2017   HIV Lab Results  Component Value Date   HIV NON-REACTIVE 11/22/2017   HIV Non Reactive 10/15/2017   Lab Results  Component Value Date   CREATININE 1.05 11/22/2017   CREATININE 1.00 10/16/2017   CREATININE 1.15 10/15/2017   CREATININE 1.02 10/11/2015   CREATININE 0.94 11/04/2014   Lab Results  Component Value Date   AST 38 (H) 11/22/2017   AST 36 10/16/2017   AST  36 10/15/2017   ALT 44 11/22/2017   ALT 47 (H) 11/22/2017   ALT 41 10/16/2017   INR 1.0 11/22/2017    Assessment: Brandon Curtis is here today to follow-up for his Hep C infection.  He finished Harvoni x 12 weeks around 4 days ago with no issues.  No missed doses and no side effects. His early on treatment Hep C viral load was already undetectable.  Will check a EOT viral load today and see him back in 3 months for his cure visit.  Will also give him a flu vaccine today.  He refused Hep A and Hep B vaccines.   Plan: - Completed Harvoni x 12 wks - Hep C viral load today - Flu shot today - F/u with me 1/31 at 10am  Cassie L. Kuppelweiser, PharmD, AAHIVP, CPP Infectious Diseases Clinical Pharmacist [facility] 03/11/2018, 3:58 PM

## 2018-03-14 LAB — HEPATITIS C RNA QUANTITATIVE
HCV QUANT LOG: NOT DETECTED {Log_IU}/mL
HCV RNA, PCR, QN: <15 IU/mL

## 2018-06-17 ENCOUNTER — Ambulatory Visit: Payer: BLUE CROSS/BLUE SHIELD | Admitting: Pharmacist

## 2018-06-21 ENCOUNTER — Ambulatory Visit (INDEPENDENT_AMBULATORY_CARE_PROVIDER_SITE_OTHER): Payer: BLUE CROSS/BLUE SHIELD | Admitting: Pharmacist

## 2018-06-21 DIAGNOSIS — B182 Chronic viral hepatitis C: Secondary | ICD-10-CM | POA: Diagnosis not present

## 2018-06-21 NOTE — Progress Notes (Signed)
HPI: Brandon BienenstockRonald E Curtis is a 58 y.o. male who presents to the [facility] pharmacy clinic for follow-up of his Hepatitis C infection.  Medication: Harvoni x 12 weeks  Start Date: 12/03/17  Hepatitis C Genotype: 1a  Fibrosis Score: F0/F1  Hepatitis C RNA: 673,000 on 6/19, <15 on 8/16 and 03/11/18  Patient Active Problem List   Diagnosis Date Noted  . Vaccine counseling 11/22/2017  . Chronic hepatitis C without hepatic coma (HCC) 11/06/2017    Patient's Medications  New Prescriptions   No medications on file  Previous Medications   LEDIPASVIR-SOFOSBUVIR (HARVONI) 90-400 MG TABS    Take 1 tablet by mouth daily.  Modified Medications   No medications on file  Discontinued Medications   No medications on file    Allergies: No Known Allergies  Past Medical History: Past Medical History:  Diagnosis Date  . Arthritis   . GERD (gastroesophageal reflux disease)     Social History: Social History   Socioeconomic History  . Marital status: Single    Spouse name: Not on file  . Number of children: 1  . Years of education: Not on file  . Highest education level: Not on file  Occupational History  . Not on file  Social Needs  . Financial resource strain: Not on file  . Food insecurity:    Worry: Not on file    Inability: Not on file  . Transportation needs:    Medical: Not on file    Non-medical: Not on file  Tobacco Use  . Smoking status: Former Smoker    Types: Cigarettes    Last attempt to quit: 10/05/2014    Years since quitting: 3.7  . Smokeless tobacco: Never Used  Substance and Sexual Activity  . Alcohol use: Not Currently    Alcohol/week: 2.0 standard drinks    Types: 2 Standard drinks or equivalent per week    Comment: 2 drinks a week  . Drug use: No  . Sexual activity: Yes  Lifestyle  . Physical activity:    Days per week: Not on file    Minutes per session: Not on file  . Stress: Not on file  Relationships  . Social connections:    Talks on phone: Not  on file    Gets together: Not on file    Attends religious service: Not on file    Active member of club or organization: Not on file    Attends meetings of clubs or organizations: Not on file    Relationship status: Not on file  Other Topics Concern  . Not on file  Social History Narrative  . Not on file    Labs: Hepatitis C Lab Results  Component Value Date   HCVGENOTYPE 1a 11/03/2017   HCVRNAPCRQN <15 NOT DETECTED 03/11/2018   HCVRNAPCRQN <15 NOT DETECTED 12/31/2017   HCVRNAPCRQN 673,000 (H) 11/03/2017   FIBROSTAGE F2 11/22/2017   Hepatitis B Lab Results  Component Value Date   HEPBSAB NON-REACTIVE 11/22/2017   HEPBSAG NON-REACTIVE 11/22/2017   HEPBCAB NON-REACTIVE 11/22/2017   Hepatitis A Lab Results  Component Value Date   HAV NON-REACTIVE 11/22/2017   HIV Lab Results  Component Value Date   HIV NON-REACTIVE 11/22/2017   HIV Non Reactive 10/15/2017   Lab Results  Component Value Date   CREATININE 1.05 11/22/2017   CREATININE 1.00 10/16/2017   CREATININE 1.15 10/15/2017   CREATININE 1.02 10/11/2015   CREATININE 0.94 11/04/2014   Lab Results  Component Value Date  AST 38 (H) 11/22/2017   AST 36 10/16/2017   AST 36 10/15/2017   ALT 44 11/22/2017   ALT 47 (H) 11/22/2017   ALT 41 10/16/2017   INR 1.0 11/22/2017    Assessment: Brandon Curtis is here today for his Hepatitis C cure visit.  He completed 12 weeks of Harvoni back in October.  His early on treatment and end of treatment Hep C viral loads have been undetectable.  Will check today for SVR12 and call him with results.   Plan: - Hep C RNA for cure - [facility] - Call with results  Cassie L. Kuppelweiser, PharmD, BCIDP, AAHIVP, CPP Infectious Diseases Clinical Pharmacist [facility] 06/21/2018, 11:15 AM

## 2018-06-23 LAB — COMPREHENSIVE METABOLIC PANEL
AG RATIO: 1.8 (calc) (ref 1.0–2.5)
ALKALINE PHOSPHATASE (APISO): 52 U/L (ref 35–144)
ALT: 24 U/L (ref 9–46)
AST: 29 U/L (ref 10–35)
Albumin: 4.5 g/dL (ref 3.6–5.1)
BILIRUBIN TOTAL: 0.3 mg/dL (ref 0.2–1.2)
BUN: 14 mg/dL (ref 7–25)
CALCIUM: 9.7 mg/dL (ref 8.6–10.3)
CO2: 31 mmol/L (ref 20–32)
Chloride: 105 mmol/L (ref 98–110)
Creat: 1.01 mg/dL (ref 0.70–1.33)
GLOBULIN: 2.5 g/dL (ref 1.9–3.7)
Glucose, Bld: 89 mg/dL (ref 65–99)
Potassium: 4.8 mmol/L (ref 3.5–5.3)
Sodium: 142 mmol/L (ref 135–146)
Total Protein: 7 g/dL (ref 6.1–8.1)

## 2018-06-23 LAB — HEPATITIS C RNA QUANTITATIVE
HCV QUANT LOG: NOT DETECTED {Log_IU}/mL
HCV RNA, PCR, QN: <15 IU/mL

## 2018-06-24 ENCOUNTER — Telehealth: Payer: Self-pay | Admitting: Pharmacist

## 2018-06-24 NOTE — Telephone Encounter (Signed)
Called patient to let him know that his Hepatitis C viral load was undetectable at his SVR12 cure visit. Left HIPAA compliant message.

## 2018-07-25 ENCOUNTER — Ambulatory Visit (HOSPITAL_COMMUNITY)
Admission: EM | Admit: 2018-07-25 | Discharge: 2018-07-25 | Disposition: A | Discharge status: Home / Self Care | Payer: BLUE CROSS/BLUE SHIELD | Attending: Family Medicine | Admitting: Family Medicine

## 2018-07-25 ENCOUNTER — Ambulatory Visit: Payer: 59 | Admitting: Emergency Medicine

## 2018-07-25 ENCOUNTER — Encounter (HOSPITAL_COMMUNITY): Payer: Self-pay | Admitting: Emergency Medicine

## 2018-07-25 DIAGNOSIS — B356 Tinea cruris: Secondary | ICD-10-CM

## 2018-07-25 MED ORDER — CLOTRIMAZOLE 1 % EX CREA: TOPICAL_CREAM | CUTANEOUS | 0 refills | Status: DC
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
## 2018-07-25 NOTE — ED Triage Notes (Signed)
Pt sts itching "between his legs"

## 2018-07-25 NOTE — Discharge Instructions (Signed)
This is ringworm Clotrimazole 2 x day  Follow up as needed for continued or worsening symptoms

## 2018-11-29 ENCOUNTER — Encounter (HOSPITAL_COMMUNITY): Payer: Self-pay | Admitting: Emergency Medicine

## 2018-11-29 ENCOUNTER — Ambulatory Visit (HOSPITAL_COMMUNITY)
Admission: EM | Admit: 2018-11-29 | Discharge: 2018-11-29 | Disposition: A | Discharge status: Home / Self Care | Payer: BC Managed Care – PPO | Attending: Emergency Medicine | Admitting: Emergency Medicine

## 2018-11-29 ENCOUNTER — Other Ambulatory Visit: Payer: Self-pay

## 2018-11-29 DIAGNOSIS — W57XXXA Bitten or stung by nonvenomous insect and other nonvenomous arthropods, initial encounter: Secondary | ICD-10-CM

## 2018-11-29 DIAGNOSIS — S40862A Insect bite (nonvenomous) of left upper arm, initial encounter: Secondary | ICD-10-CM

## 2018-11-29 MED ORDER — METHYLPREDNISOLONE SODIUM SUCC 125 MG IJ SOLR
INTRAMUSCULAR | Status: AC
  Filled 2018-11-29: qty 2

## 2018-11-29 MED ORDER — METHYLPREDNISOLONE SODIUM SUCC 125 MG IJ SOLR
80.0000 mg | Freq: Once | INTRAMUSCULAR | Status: AC
  Administered 2018-11-29: 80 mg via INTRAMUSCULAR

## 2018-11-29 NOTE — ED Triage Notes (Signed)
Patient says he was bit by something one week ago.  Area was itchy, area swelled and the size of a "silver dollar".  Location is on left upper arm.  Reports swelling has improved.
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
## 2018-11-29 NOTE — Discharge Instructions (Signed)
You were given a shot today of Solu-Medrol which is a steroid.   Return here or go to the emergency department if you develop fever, chills, redness, an open sore, drainage, warmth, or other concerning symptoms.

## 2018-12-13 ENCOUNTER — Ambulatory Visit: Payer: BC Managed Care – PPO | Admitting: Registered Nurse

## 2018-12-13 ENCOUNTER — Other Ambulatory Visit: Payer: Self-pay

## 2018-12-13 ENCOUNTER — Encounter: Payer: Self-pay | Admitting: Registered Nurse

## 2018-12-13 VITALS — BP 127/84 | HR 66 | Temp 98.3°F | Resp 16 | Ht 68.7 in | Wt 170.0 lb

## 2018-12-13 DIAGNOSIS — H109 Unspecified conjunctivitis: Secondary | ICD-10-CM | POA: Insufficient documentation

## 2018-12-13 DIAGNOSIS — F411 Generalized anxiety disorder: Secondary | ICD-10-CM | POA: Insufficient documentation

## 2018-12-13 MED ORDER — BUSPIRONE HCL 7.5 MG PO TABS: 7.5000 mg | ORAL_TABLET | Freq: Three times a day (TID) | ORAL | 0 refills | Status: DC

## 2018-12-13 MED ORDER — ERYTHROMYCIN 5 MG/GM OP OINT: 1.0000 "application " | TOPICAL_OINTMENT | Freq: Every day | OPHTHALMIC | 0 refills | Status: DC

## 2018-12-13 NOTE — Patient Instructions (Signed)
° ° ° °  If you have lab work done today you will be contacted with your lab results within the next 2 weeks.  If you have not heard from us then please contact us. The fastest way to get your results is to register for My Chart. ° ° °IF you received an x-ray today, you will receive an invoice from [facility]. Please contact [facility] at [phone] with questions or concerns regarding your invoice.  ° °IF you received labwork today, you will receive an invoice from [facility]. Please contact [facility] at [phone] with questions or concerns regarding your invoice.  ° °Our billing staff will not be able to assist you with questions regarding bills from these companies. ° °You will be contacted with the lab results as soon as they are available. The fastest way to get your results is to activate your My Chart account. Instructions are located on the last page of this paperwork. If you have not heard from us regarding the results in 2 weeks, please contact this office. °  ° ° ° °
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
## 2019-01-13 ENCOUNTER — Encounter: Payer: BC Managed Care – PPO | Admitting: Registered Nurse

## 2019-08-21 ENCOUNTER — Encounter (HOSPITAL_COMMUNITY): Payer: Self-pay

## 2019-08-21 ENCOUNTER — Other Ambulatory Visit: Payer: Self-pay

## 2019-08-21 ENCOUNTER — Ambulatory Visit (HOSPITAL_COMMUNITY)
Admission: EM | Admit: 2019-08-21 | Discharge: 2019-08-21 | Disposition: A | Discharge status: Home / Self Care | Payer: Self-pay | Attending: Physician Assistant | Admitting: Physician Assistant

## 2019-08-21 DIAGNOSIS — E860 Dehydration: Secondary | ICD-10-CM

## 2019-08-21 DIAGNOSIS — R030 Elevated blood-pressure reading, without diagnosis of hypertension: Secondary | ICD-10-CM

## 2019-08-21 DIAGNOSIS — R351 Nocturia: Secondary | ICD-10-CM

## 2019-08-21 LAB — POCT URINALYSIS DIP (DEVICE)
Bilirubin Urine: NEGATIVE
Glucose, UA: NEGATIVE mg/dL
Hgb urine dipstick: NEGATIVE
Ketones, ur: NEGATIVE mg/dL
Leukocytes,Ua: NEGATIVE
Nitrite: NEGATIVE
Protein, ur: NEGATIVE mg/dL
Specific Gravity, Urine: 1.025 (ref 1.005–1.030)
Urobilinogen, UA: 0.2 mg/dL (ref 0.0–1.0)
pH: 5.5 (ref 5.0–8.0)

## 2019-08-21 LAB — GLUCOSE, CAPILLARY: Glucose-Capillary: 92 mg/dL (ref 70–99)

## 2019-08-21 LAB — CBG MONITORING, ED: Glucose-Capillary: 92 mg/dL (ref 70–99)

## 2019-08-21 NOTE — ED Triage Notes (Signed)
Pt states he has had dry mouth and frequent urination x 2 weeks
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
## 2019-08-21 NOTE — Discharge Instructions (Addendum)
Your urine and glucose looked normal today  Please spread out your water intake to earlier in the day. Try not to drink large amounts of water in the evening right before bed  Please follow up with the internal medicine clinic given to establish care for primary care and have a blood pressure recheck in 2 weeks. If you are not able to be seen in 2 weeks please return to our clinic for blood pressure recheck.  Please check your blood pressure at home, if you seen numbers 180-200 on the stop number please return.  If you develop chest pain, severe headache, numbness, weakness, shortness of breath or vision changes please go to the emergency department.

## 2019-09-05 ENCOUNTER — Ambulatory Visit: Payer: Self-pay | Admitting: Family Medicine

## 2019-10-02 ENCOUNTER — Ambulatory Visit: Payer: Self-pay | Admitting: Family Medicine

## 2019-12-19 ENCOUNTER — Other Ambulatory Visit: Payer: Self-pay

## 2019-12-19 ENCOUNTER — Ambulatory Visit (HOSPITAL_COMMUNITY)
Admission: EM | Admit: 2019-12-19 | Discharge: 2019-12-19 | Disposition: A | Discharge status: Home / Self Care | Payer: Self-pay | Attending: Family Medicine | Admitting: Family Medicine

## 2019-12-19 ENCOUNTER — Encounter (HOSPITAL_COMMUNITY): Payer: Self-pay

## 2019-12-19 DIAGNOSIS — B359 Dermatophytosis, unspecified: Secondary | ICD-10-CM

## 2019-12-19 DIAGNOSIS — M01X59 Direct infection of unspecified hip in infectious and parasitic diseases classified elsewhere: Secondary | ICD-10-CM

## 2019-12-19 DIAGNOSIS — B49 Unspecified mycosis: Secondary | ICD-10-CM

## 2019-12-19 MED ORDER — TERBINAFINE HCL 1 % EX CREA: 1.0000 "application " | TOPICAL_CREAM | Freq: Two times a day (BID) | CUTANEOUS | 0 refills | Status: DC

## 2019-12-19 NOTE — ED Triage Notes (Signed)
Pt is here with groin tingling pain on the tip of penis that started 3 weeks ago. Pt states he use to have bugs that sometimes would bite at his gentiles but states it does not happen anymore. Pt has not taken anything to relieve discomfort. Pt denies itching, burning, odor, discharge today.

## 2019-12-19 NOTE — Discharge Instructions (Signed)
Use cream as directed If not better in 1 to 2 weeks call [facility] for follow-up appointment
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
## 2020-01-24 ENCOUNTER — Other Ambulatory Visit: Payer: Self-pay

## 2020-01-24 ENCOUNTER — Encounter: Payer: Self-pay | Admitting: Registered Nurse

## 2020-01-24 ENCOUNTER — Ambulatory Visit (INDEPENDENT_AMBULATORY_CARE_PROVIDER_SITE_OTHER): Payer: Self-pay | Admitting: Registered Nurse

## 2020-01-24 VITALS — BP 144/90 | HR 71 | Temp 97.3°F | Resp 17 | Ht 69.0 in | Wt 170.2 lb

## 2020-01-24 DIAGNOSIS — B356 Tinea cruris: Secondary | ICD-10-CM

## 2020-01-24 MED ORDER — NYSTATIN 100000 UNIT/GM EX POWD: 1.0000 "application " | Freq: Three times a day (TID) | CUTANEOUS | 0 refills | Status: DC

## 2020-01-24 MED ORDER — TERBINAFINE HCL 1 % EX CREA: 1.0000 "application " | TOPICAL_CREAM | Freq: Two times a day (BID) | CUTANEOUS | 2 refills | Status: DC

## 2020-01-24 NOTE — Patient Instructions (Signed)
° ° ° °  If you have lab work done today you will be contacted with your lab results within the next 2 weeks.  If you have not heard from us then please contact us. The fastest way to get your results is to register for My Chart. ° ° °IF you received an x-ray today, you will receive an invoice from [facility]. Please contact [facility] at [phone] with questions or concerns regarding your invoice.  ° °IF you received labwork today, you will receive an invoice from [facility]. Please contact [facility] at [phone] with questions or concerns regarding your invoice.  ° °Our billing staff will not be able to assist you with questions regarding bills from these companies. ° °You will be contacted with the lab results as soon as they are available. The fastest way to get your results is to activate your My Chart account. Instructions are located on the last page of this paperwork. If you have not heard from us regarding the results in 2 weeks, please contact this office. °  ° ° ° °

## 2020-03-05 ENCOUNTER — Ambulatory Visit (INDEPENDENT_AMBULATORY_CARE_PROVIDER_SITE_OTHER): Payer: Self-pay | Admitting: Registered Nurse

## 2020-03-05 ENCOUNTER — Other Ambulatory Visit: Payer: Self-pay

## 2020-03-05 ENCOUNTER — Encounter: Payer: Self-pay | Admitting: Registered Nurse

## 2020-03-05 DIAGNOSIS — B356 Tinea cruris: Secondary | ICD-10-CM

## 2020-03-05 MED ORDER — TERBINAFINE HCL 250 MG PO TABS: 250.0000 mg | ORAL_TABLET | Freq: Every day | ORAL | 0 refills | Status: DC

## 2020-03-05 MED ORDER — DOXYCYCLINE HYCLATE 100 MG PO TABS: 100.0000 mg | ORAL_TABLET | Freq: Two times a day (BID) | ORAL | 0 refills | Status: DC

## 2020-03-05 MED ORDER — NYSTATIN 100000 UNIT/GM EX POWD: 1.0000 "application " | Freq: Three times a day (TID) | CUTANEOUS | 0 refills | Status: DC

## 2020-03-05 MED ORDER — TRIAMCINOLONE ACETONIDE 0.1 % EX CREA: 1.0000 "application " | TOPICAL_CREAM | Freq: Every day | CUTANEOUS | 0 refills | Status: DC

## 2020-03-05 NOTE — Patient Instructions (Signed)
° ° ° °  If you have lab work done today you will be contacted with your lab results within the next 2 weeks.  If you have not heard from us then please contact us. The fastest way to get your results is to register for My Chart. ° ° °IF you received an x-ray today, you will receive an invoice from [facility]. Please contact [facility] at [phone] with questions or concerns regarding your invoice.  ° °IF you received labwork today, you will receive an invoice from [facility]. Please contact [facility] at [phone] with questions or concerns regarding your invoice.  ° °Our billing staff will not be able to assist you with questions regarding bills from these companies. ° °You will be contacted with the lab results as soon as they are available. The fastest way to get your results is to activate your My Chart account. Instructions are located on the last page of this paperwork. If you have not heard from us regarding the results in 2 weeks, please contact this office. °  ° ° ° °

## 2020-04-08 ENCOUNTER — Encounter: Payer: Self-pay | Admitting: Registered Nurse
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
## 2020-04-29 ENCOUNTER — Encounter: Payer: Self-pay | Admitting: Registered Nurse
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
## 2020-07-09 ENCOUNTER — Ambulatory Visit (INDEPENDENT_AMBULATORY_CARE_PROVIDER_SITE_OTHER): Payer: 59 | Admitting: Registered Nurse

## 2020-07-09 ENCOUNTER — Other Ambulatory Visit: Payer: Self-pay

## 2020-07-09 ENCOUNTER — Encounter: Payer: Self-pay | Admitting: Registered Nurse

## 2020-07-09 VITALS — BP 149/90 | HR 60 | Temp 98.0°F | Resp 18 | Ht 69.0 in | Wt 168.2 lb

## 2020-07-09 DIAGNOSIS — N485 Ulcer of penis: Secondary | ICD-10-CM

## 2020-07-09 DIAGNOSIS — Z130 Encounter for screening for diseases of the blood and blood-forming organs and certain disorders involving the immune mechanism: Secondary | ICD-10-CM

## 2020-07-09 DIAGNOSIS — B356 Tinea cruris: Secondary | ICD-10-CM

## 2020-07-09 DIAGNOSIS — Z13228 Encounter for screening for other metabolic disorders: Secondary | ICD-10-CM

## 2020-07-09 DIAGNOSIS — Z1329 Encounter for screening for other suspected endocrine disorder: Secondary | ICD-10-CM | POA: Diagnosis not present

## 2020-07-09 DIAGNOSIS — Z13220 Encounter for screening for lipoid disorders: Secondary | ICD-10-CM | POA: Diagnosis not present

## 2020-07-09 MED ORDER — NYSTATIN 100000 UNIT/GM EX POWD
1.0000 | Freq: Three times a day (TID) | CUTANEOUS | 0 refills | Status: DC
Start: 2020-07-09 — End: 2022-07-12

## 2020-07-09 MED ORDER — TERBINAFINE HCL 250 MG PO TABS: 250.0000 mg | ORAL_TABLET | Freq: Every day | ORAL | 0 refills | Status: DC

## 2020-07-09 MED ORDER — NYSTATIN 100000 UNIT/GM EX POWD: 1.0000 "application " | Freq: Three times a day (TID) | CUTANEOUS | 0 refills | Status: DC
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
## 2020-07-09 NOTE — Patient Instructions (Signed)
° ° ° °  If you have lab work done today you will be contacted with your lab results within the next 2 weeks.  If you have not heard from us then please contact us. The fastest way to get your results is to register for My Chart. ° ° °IF you received an x-ray today, you will receive an invoice from [facility]. Please contact [facility] at [phone] with questions or concerns regarding your invoice.  ° °IF you received labwork today, you will receive an invoice from [facility]. Please contact [facility] at [phone] with questions or concerns regarding your invoice.  ° °Our billing staff will not be able to assist you with questions regarding bills from these companies. ° °You will be contacted with the lab results as soon as they are available. The fastest way to get your results is to activate your My Chart account. Instructions are located on the last page of this paperwork. If you have not heard from us regarding the results in 2 weeks, please contact this office. °  ° ° ° °

## 2020-07-10 LAB — CBC WITH DIFFERENTIAL
Basophils Absolute: 0 10*3/uL (ref 0.0–0.2)
Basos: 1 %
EOS (ABSOLUTE): 0.1 10*3/uL (ref 0.0–0.4)
Eos: 2 %
Hematocrit: 40.8 % (ref 37.5–51.0)
Hemoglobin: 13.4 g/dL (ref 13.0–17.7)
Immature Grans (Abs): 0 10*3/uL (ref 0.0–0.1)
Immature Granulocytes: 0 %
Lymphocytes Absolute: 1.5 10*3/uL (ref 0.7–3.1)
Lymphs: 40 %
MCH: 30.2 pg (ref 26.6–33.0)
MCHC: 32.8 g/dL (ref 31.5–35.7)
MCV: 92 fL (ref 79–97)
Monocytes Absolute: 0.5 10*3/uL (ref 0.1–0.9)
Monocytes: 13 %
Neutrophils Absolute: 1.7 10*3/uL (ref 1.4–7.0)
Neutrophils: 44 %
RBC: 4.43 x10E6/uL (ref 4.14–5.80)
RDW: 12.5 % (ref 11.6–15.4)
WBC: 3.8 10*3/uL (ref 3.4–10.8)

## 2020-07-10 LAB — LIPID PANEL
Chol/HDL Ratio: 3.3 ratio (ref 0.0–5.0)
Cholesterol, Total: 209 mg/dL — ABNORMAL HIGH (ref 100–199)
HDL: 63 mg/dL (ref >39)
LDL Chol Calc (NIH): 129 mg/dL — ABNORMAL HIGH (ref 0–99)
Triglycerides: 95 mg/dL (ref 0–149)
VLDL Cholesterol Cal: 17 mg/dL (ref 5–40)

## 2020-07-10 LAB — COMPREHENSIVE METABOLIC PANEL
ALT: 18 IU/L (ref 0–44)
AST: 25 IU/L (ref 0–40)
Albumin/Globulin Ratio: 1.5 (ref 1.2–2.2)
Albumin: 4.6 g/dL (ref 3.8–4.8)
Alkaline Phosphatase: 62 IU/L (ref 44–121)
BUN/Creatinine Ratio: 14 (ref 10–24)
BUN: 13 mg/dL (ref 8–27)
Bilirubin Total: 0.4 mg/dL (ref 0.0–1.2)
CO2: 21 mmol/L (ref 20–29)
Calcium: 9.6 mg/dL (ref 8.6–10.2)
Chloride: 104 mmol/L (ref 96–106)
Creatinine, Ser: 0.92 mg/dL (ref 0.76–1.27)
GFR calc Af Amer: 103 mL/min/{1.73_m2} (ref >59)
GFR calc non Af Amer: 89 mL/min/{1.73_m2} (ref >59)
Globulin, Total: 3.1 g/dL (ref 1.5–4.5)
Glucose: 89 mg/dL (ref 65–99)
Potassium: 4.7 mmol/L (ref 3.5–5.2)
Sodium: 140 mmol/L (ref 134–144)
Total Protein: 7.7 g/dL (ref 6.0–8.5)

## 2020-07-10 LAB — RPR: RPR Ser Ql: NONREACTIVE

## 2020-07-10 LAB — HEMOGLOBIN A1C
Est. average glucose Bld gHb Est-mCnc: 117 mg/dL
Hgb A1c MFr Bld: 5.7 % — ABNORMAL HIGH (ref 4.8–5.6)

## 2020-07-10 LAB — HIV ANTIBODY (ROUTINE TESTING W REFLEX): HIV Screen 4th Generation wRfx: NONREACTIVE

## 2020-07-10 LAB — TSH: TSH: 1.29 u[IU]/mL (ref 0.450–4.500)

## 2020-07-12 ENCOUNTER — Encounter: Payer: Self-pay | Admitting: Registered Nurse

## 2020-09-22 ENCOUNTER — Encounter (HOSPITAL_COMMUNITY): Payer: Self-pay | Admitting: *Deleted

## 2020-09-22 ENCOUNTER — Ambulatory Visit (HOSPITAL_COMMUNITY)
Admission: EM | Admit: 2020-09-22 | Discharge: 2020-09-22 | Disposition: A | Discharge status: Home / Self Care | Payer: 59 | Attending: Sports Medicine | Admitting: Sports Medicine

## 2020-09-22 ENCOUNTER — Other Ambulatory Visit: Payer: Self-pay

## 2020-09-22 DIAGNOSIS — N5089 Other specified disorders of the male genital organs: Secondary | ICD-10-CM | POA: Diagnosis not present

## 2020-09-22 DIAGNOSIS — W57XXXA Bitten or stung by nonvenomous insect and other nonvenomous arthropods, initial encounter: Secondary | ICD-10-CM | POA: Diagnosis not present

## 2020-09-22 DIAGNOSIS — S30863A Insect bite (nonvenomous) of scrotum and testes, initial encounter: Secondary | ICD-10-CM | POA: Diagnosis not present

## 2020-09-22 MED ORDER — DOXYCYCLINE HYCLATE 100 MG PO CAPS: 100.0000 mg | ORAL_CAPSULE | Freq: Two times a day (BID) | ORAL | 0 refills | Status: DC
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
## 2020-09-22 NOTE — Discharge Instructions (Signed)
As we discussed, I am going to treat you for the tick bite given your symptoms and your swelling.  Please do not irritate that area.  Do not squeeze or try to express any fluid out of that area. I have given you an antibiotic.  Please take it in its entirety as directed. I given you educational handouts. If your symptoms persist or worsen please see your primary care physician or go to the ER.

## 2020-09-22 NOTE — ED Triage Notes (Signed)
Pt removed a tick from his scrotum yesterday. Pt reports swelling ans rash at tick bite site.

## 2021-03-22 ENCOUNTER — Ambulatory Visit (HOSPITAL_COMMUNITY)
Admission: EM | Admit: 2021-03-22 | Discharge: 2021-03-22 | Disposition: A | Discharge status: Home / Self Care | Payer: 59 | Attending: Family Medicine | Admitting: Family Medicine

## 2021-03-22 ENCOUNTER — Other Ambulatory Visit: Payer: Self-pay

## 2021-03-22 DIAGNOSIS — N489 Disorder of penis, unspecified: Secondary | ICD-10-CM

## 2021-03-22 LAB — HIV ANTIBODY (ROUTINE TESTING W REFLEX): HIV Screen 4th Generation wRfx: NONREACTIVE

## 2021-03-22 MED ORDER — HYDROCORTISONE 1 % EX CREA: TOPICAL_CREAM | CUTANEOUS | 0 refills | Status: DC
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
## 2021-03-22 NOTE — ED Triage Notes (Signed)
Pt reports a lesion on his penis that has been there for a couple of weeks. Lesion has drained off and on.

## 2021-03-23 LAB — RPR: RPR Ser Ql: NONREACTIVE

## 2021-03-24 ENCOUNTER — Encounter (HOSPITAL_COMMUNITY): Payer: Self-pay | Admitting: Emergency Medicine

## 2021-03-24 ENCOUNTER — Emergency Department (HOSPITAL_COMMUNITY)
Admission: EM | Admit: 2021-03-24 | Discharge: 2021-03-24 | Disposition: A | Discharge status: Home / Self Care | Payer: 59 | Attending: Emergency Medicine | Admitting: Emergency Medicine

## 2021-03-24 ENCOUNTER — Emergency Department (HOSPITAL_COMMUNITY): Payer: 59

## 2021-03-24 DIAGNOSIS — Z23 Encounter for immunization: Secondary | ICD-10-CM | POA: Insufficient documentation

## 2021-03-24 DIAGNOSIS — S61210A Laceration without foreign body of right index finger without damage to nail, initial encounter: Secondary | ICD-10-CM | POA: Diagnosis present

## 2021-03-24 DIAGNOSIS — Z87891 Personal history of nicotine dependence: Secondary | ICD-10-CM | POA: Insufficient documentation

## 2021-03-24 DIAGNOSIS — Z79899 Other long term (current) drug therapy: Secondary | ICD-10-CM | POA: Insufficient documentation

## 2021-03-24 DIAGNOSIS — Y9289 Other specified places as the place of occurrence of the external cause: Secondary | ICD-10-CM | POA: Insufficient documentation

## 2021-03-24 DIAGNOSIS — W231XXA Caught, crushed, jammed, or pinched between stationary objects, initial encounter: Secondary | ICD-10-CM | POA: Insufficient documentation

## 2021-03-24 LAB — CYTOLOGY, (ORAL, ANAL, URETHRAL) ANCILLARY ONLY
Chlamydia: NEGATIVE
Comment: NEGATIVE
Comment: NEGATIVE
Comment: NORMAL
Neisseria Gonorrhea: NEGATIVE
Trichomonas: NEGATIVE

## 2021-03-24 MED ORDER — BACITRACIN ZINC 500 UNIT/GM EX OINT
TOPICAL_OINTMENT | Freq: Every day | CUTANEOUS | Status: DC
  Administered 2021-03-24: 1 via TOPICAL
  Filled 2021-03-24: qty 0.9

## 2021-03-24 MED ORDER — TETANUS-DIPHTH-ACELL PERTUSSIS 5-2.5-18.5 LF-MCG/0.5 IM SUSY
0.5000 mL | PREFILLED_SYRINGE | Freq: Once | INTRAMUSCULAR | Status: AC
  Administered 2021-03-24: 0.5 mL via INTRAMUSCULAR
  Filled 2021-03-24: qty 0.5

## 2021-03-24 MED ORDER — HYDROCODONE-ACETAMINOPHEN 5-325 MG PO TABS: 1.0000 | ORAL_TABLET | Freq: Four times a day (QID) | ORAL | 0 refills | Status: DC | PRN

## 2021-03-24 MED ORDER — OXYCODONE-ACETAMINOPHEN 5-325 MG PO TABS
1.0000 | ORAL_TABLET | Freq: Once | ORAL | Status: AC
  Administered 2021-03-24: 1 via ORAL
  Filled 2021-03-24: qty 1

## 2021-03-24 MED ORDER — LIDOCAINE HCL (PF) 1 % IJ SOLN
10.0000 mL | Freq: Once | INTRAMUSCULAR | Status: AC
  Administered 2021-03-24: 10 mL
  Filled 2021-03-24: qty 30

## 2021-03-24 MED ORDER — CEPHALEXIN 500 MG PO CAPS: 500.0000 mg | ORAL_CAPSULE | Freq: Four times a day (QID) | ORAL | 0 refills | Status: DC

## 2021-03-24 NOTE — ED Triage Notes (Signed)
Per pt,, states he was working on a car when the jack slipped and car fell on his right hand injuring right index finger-states he went to UC and they sent him here-states "bone is showing"-
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
## 2021-03-24 NOTE — Discharge Instructions (Signed)
It was a pleasure taking care of you today.  As discussed, I have included the number of the hand surgeon.  Please call to schedule an appointment for further evaluation.  You will need your sutures removed in 10-14 days.  I am sending you home with an antibiotic.  Take daily and finish all antibiotics.  I am also sending you home with pain medication. Return to the ER for new or worsening symptoms.

## 2021-03-25 ENCOUNTER — Telehealth (HOSPITAL_COMMUNITY): Payer: Self-pay | Admitting: Student

## 2021-03-25 MED ORDER — OXYCODONE HCL 5 MG PO TABS: 5.0000 mg | ORAL_TABLET | ORAL | 0 refills | Status: DC | PRN

## 2021-03-25 NOTE — Telephone Encounter (Signed)
Encounter created to send rx to different pharmacy due to percocet shortage.

## 2021-04-01 ENCOUNTER — Encounter: Payer: Self-pay | Admitting: Orthopedic Surgery

## 2021-04-01 ENCOUNTER — Other Ambulatory Visit: Payer: Self-pay

## 2021-04-01 ENCOUNTER — Ambulatory Visit (INDEPENDENT_AMBULATORY_CARE_PROVIDER_SITE_OTHER): Payer: 59 | Admitting: Orthopedic Surgery

## 2021-04-01 VITALS — BP 173/91 | HR 92 | Ht 69.0 in | Wt 168.0 lb

## 2021-04-01 DIAGNOSIS — S61210A Laceration without foreign body of right index finger without damage to nail, initial encounter: Secondary | ICD-10-CM | POA: Diagnosis not present

## 2021-04-03 ENCOUNTER — Ambulatory Visit: Payer: 59 | Admitting: Orthopedic Surgery

## 2021-04-07 ENCOUNTER — Other Ambulatory Visit: Payer: Self-pay

## 2021-04-07 ENCOUNTER — Ambulatory Visit (INDEPENDENT_AMBULATORY_CARE_PROVIDER_SITE_OTHER): Payer: 59 | Admitting: Orthopedic Surgery

## 2021-04-07 DIAGNOSIS — S61210A Laceration without foreign body of right index finger without damage to nail, initial encounter: Secondary | ICD-10-CM | POA: Diagnosis not present
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
## 2021-04-18 ENCOUNTER — Ambulatory Visit (INDEPENDENT_AMBULATORY_CARE_PROVIDER_SITE_OTHER): Payer: 59 | Admitting: Orthopedic Surgery

## 2021-04-18 ENCOUNTER — Encounter: Payer: Self-pay | Admitting: Orthopedic Surgery

## 2021-04-18 ENCOUNTER — Other Ambulatory Visit: Payer: Self-pay

## 2021-04-18 VITALS — BP 141/80 | HR 72

## 2021-04-18 DIAGNOSIS — S61210A Laceration without foreign body of right index finger without damage to nail, initial encounter: Secondary | ICD-10-CM
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
## 2021-05-29 ENCOUNTER — Ambulatory Visit (INDEPENDENT_AMBULATORY_CARE_PROVIDER_SITE_OTHER): Payer: 59 | Admitting: Podiatry

## 2021-05-29 ENCOUNTER — Other Ambulatory Visit: Payer: Self-pay

## 2021-05-29 ENCOUNTER — Ambulatory Visit (INDEPENDENT_AMBULATORY_CARE_PROVIDER_SITE_OTHER): Payer: 59

## 2021-05-29 DIAGNOSIS — M778 Other enthesopathies, not elsewhere classified: Secondary | ICD-10-CM

## 2021-05-29 DIAGNOSIS — M67471 Ganglion, right ankle and foot: Secondary | ICD-10-CM | POA: Diagnosis not present

## 2021-06-02 NOTE — Progress Notes (Signed)
°  Subjective:  Patient ID: Brandon Curtis, male    DOB: [birth year],  MRN: [id number]  Chief Complaint  Patient presents with   Foot Pain    Right foot pain and swelling     61 y.o. male presents with the above complaint. History confirmed with patient.  States that he is been having some swelling and pain with his foot he said he broke his foot about 1520 years ago denies having surgery at that time.  Most the pain is in the dorsal forefoot on the right foot  Objective:  Physical Exam: warm, good capillary refill, no trophic changes or ulcerative lesions, normal DP and PT pulses, normal sensory exam, and right foot he has pain along the extensor tendons and a dorsal ganglion in the midfoot with palpable spur underneath.  Radiographs: Multiple views x-ray of the right foot: no fracture, dislocation, swelling or degenerative changes noted Assessment:   1. Ganglion cyst of right foot   2. Extensor tendinitis of foot      Plan:  Patient was evaluated and treated and all questions answered.  Suspect the pain he is having is accommodation of extensor tendinitis as well as ganglion cyst in the midfoot.  We discussed the etiology and treatment options of these.  It is not currently painful at the ganglion site.  I did discuss evaluating with an MRI to determine the need for drainage or surgical excision and I have ordered this.  I recommended physical therapy for the tendinitis.  I will see him back after the MRI or in about 2 months to reevaluate  Return for call to schedule after MRI to review.

## 2021-06-10 ENCOUNTER — Encounter: Payer: Self-pay | Admitting: Registered Nurse

## 2021-06-10 ENCOUNTER — Ambulatory Visit (INDEPENDENT_AMBULATORY_CARE_PROVIDER_SITE_OTHER): Payer: 59 | Admitting: Registered Nurse

## 2021-06-10 VITALS — BP 142/78 | HR 87 | Temp 97.8°F | Resp 16 | Ht 69.0 in | Wt 168.0 lb

## 2021-06-10 DIAGNOSIS — M79671 Pain in right foot: Secondary | ICD-10-CM

## 2021-06-10 MED ORDER — MELOXICAM 15 MG PO TABS: 15.0000 mg | ORAL_TABLET | Freq: Every day | ORAL | 0 refills | Status: DC
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
## 2021-06-10 NOTE — Patient Instructions (Addendum)
Mr. Schaible -   Good to see you!  Seems like it could be aspects of tendonitis and plantar fasciitis.  Take one meloxicam daily as needed for pain. Ok to take tylenol with this, but avoid any motrin, ibuprofen, etc.  Alternate ice and heat. Recommend ice in a bag wrapped in something fabric for ice pack. Heat pack can be made with dry uncooked rice in a sock heated in the microwave for 3-4 seconds.  Can use a lacrosse ball, tennis ball, etc to roll the foot on in the evenings to help stretch.  Call if you need anything or if things fail to improve  Thank you  Rich

## 2021-07-02 ENCOUNTER — Ambulatory Visit
Admission: RE | Admit: 2021-07-02 | Discharge: 2021-07-02 | Disposition: A | Discharge status: Home / Self Care | Payer: 59 | Source: Ambulatory Visit | Attending: Podiatry | Admitting: Podiatry

## 2021-07-02 ENCOUNTER — Other Ambulatory Visit: Payer: Self-pay

## 2021-07-02 DIAGNOSIS — M67471 Ganglion, right ankle and foot: Secondary | ICD-10-CM

## 2021-07-02 MED ORDER — GADOBENATE DIMEGLUMINE 529 MG/ML IV SOLN
15.0000 mL | Freq: Once | INTRAVENOUS | Status: AC | PRN
  Administered 2021-07-02: 15 mL via INTRAVENOUS

## 2021-07-05 ENCOUNTER — Encounter (HOSPITAL_COMMUNITY): Payer: Self-pay | Admitting: Emergency Medicine

## 2021-07-05 ENCOUNTER — Ambulatory Visit (HOSPITAL_COMMUNITY)
Admission: EM | Admit: 2021-07-05 | Discharge: 2021-07-05 | Disposition: A | Discharge status: Home / Self Care | Payer: 59 | Attending: Internal Medicine | Admitting: Internal Medicine

## 2021-07-05 DIAGNOSIS — J029 Acute pharyngitis, unspecified: Secondary | ICD-10-CM | POA: Diagnosis not present

## 2021-07-05 DIAGNOSIS — Z20822 Contact with and (suspected) exposure to covid-19: Secondary | ICD-10-CM | POA: Insufficient documentation

## 2021-07-05 DIAGNOSIS — B349 Viral infection, unspecified: Secondary | ICD-10-CM | POA: Diagnosis not present

## 2021-07-05 LAB — POC INFLUENZA A AND B ANTIGEN (URGENT CARE ONLY)
INFLUENZA A ANTIGEN, POC: NEGATIVE
INFLUENZA B ANTIGEN, POC: NEGATIVE

## 2021-07-05 LAB — POCT RAPID STREP A, ED / UC: Streptococcus, Group A Screen (Direct): NEGATIVE

## 2021-07-05 MED ORDER — IBUPROFEN 400 MG PO TABS: 400.0000 mg | ORAL_TABLET | Freq: Three times a day (TID) | ORAL | 0 refills | Status: DC | PRN

## 2021-07-05 MED ORDER — LIDOCAINE VISCOUS HCL 2 % MT SOLN: 15.0000 mL | OROMUCOSAL | 0 refills | Status: DC | PRN
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
## 2021-07-05 NOTE — Discharge Instructions (Addendum)
Your symptoms and physical exam findings are concerning for a viral respiratory infection.  You were tested for both COVID and influenza today, your influenza test today was negative and the result of your COVID test  will be posted to your MyChart once it is complete, this typically takes 6 to 12 hours.  If there is a positive result, you will be contacted by phone and provided with antiviral treatment.   Your strep test today is negative.  Throat culture will be performed per our protocol.  The result of your throat culture will be posted to your MyChart once it is complete, this typically takes 3 to 5 days.  If there is a positive result, you will be contacted by phone and antibiotics will be prescribed for you.  Please see the list below for recommended medications, dosages and frequencies to provide relief of your current symptoms:    Ibuprofen  (Advil, Motrin): This is a good anti-inflammatory medication which addresses aches, pains and inflammation of the upper airways that causes sinus and nasal congestion as well as in the lower airways which makes your cough feel tight and sometimes burn.  I recommend that you take between 400 to 600 mg every 6-8 hours as needed, I have provided you with a prescription for 400 mg.      Lidocaine (Xylocaine): This is a numbing medication that can be swished for 15 seconds and swallowed.  You can use this every 3 hours while awake to relieve pain in your mouth and throat.  I have sent a prescription for this medication to your pharmacy.   Conservative care is also recommended at this time.  This includes rest, pushing clear fluids and activity as tolerated.  Warm beverages such as teas and broths versus cold beverages/popsicles and frozen sherbet/sorbet are your choice, both warm and cold are beneficial.  You may also notice that your appetite is reduced; this is okay as long as you are drinking plenty of clear fluids.    Please remain home from work, school,  public places until you have been symptom free for 24 hours.  I have provided you with a note to be out of work.   Please follow-up within the next 3 to 5 days either with your primary care provider or urgent care if your symptoms do not resolve.  If you do not have a primary care provider, we will assist you in finding one.  Thank you for visiting urgent care today.  I appreciate the opportunity to participate in your care.

## 2021-07-05 NOTE — ED Triage Notes (Signed)
Pt c/o sore throat for a few days  

## 2021-07-06 LAB — SARS CORONAVIRUS 2 (TAT 6-24 HRS): SARS Coronavirus 2: NEGATIVE

## 2021-07-07 ENCOUNTER — Other Ambulatory Visit: Payer: Self-pay | Admitting: Registered Nurse

## 2021-07-07 DIAGNOSIS — M79671 Pain in right foot: Secondary | ICD-10-CM

## 2021-07-07 NOTE — Telephone Encounter (Signed)
Patient is requesting a refill of the following medications: Requested Prescriptions   Pending Prescriptions Disp Refills   meloxicam (MOBIC) 15 MG tablet [Pharmacy Med Name: MELOXICAM 15 MG TABLET] 30 tablet 0    Sig: TAKE 1 TABLET (15 MG TOTAL) BY MOUTH DAILY.    Date of patient request: 07/07/2021 Last office visit: 06/10/2021 Date of last refill: 06/10/2021 Last refill amount: 30 tablets  Follow up time period per chart: n/a

## 2021-07-08 LAB — CULTURE, GROUP A STREP (THRC)

## 2021-09-06 ENCOUNTER — Ambulatory Visit (HOSPITAL_COMMUNITY)
Admission: EM | Admit: 2021-09-06 | Discharge: 2021-09-06 | Disposition: A | Discharge status: Home / Self Care | Payer: 59 | Attending: Family Medicine | Admitting: Family Medicine

## 2021-09-06 ENCOUNTER — Encounter (HOSPITAL_COMMUNITY): Payer: Self-pay | Admitting: *Deleted

## 2021-09-06 ENCOUNTER — Other Ambulatory Visit: Payer: Self-pay

## 2021-09-06 DIAGNOSIS — S70362A Insect bite (nonvenomous), left thigh, initial encounter: Secondary | ICD-10-CM | POA: Diagnosis not present

## 2021-09-06 DIAGNOSIS — R208 Other disturbances of skin sensation: Secondary | ICD-10-CM | POA: Diagnosis not present

## 2021-09-06 DIAGNOSIS — R030 Elevated blood-pressure reading, without diagnosis of hypertension: Secondary | ICD-10-CM

## 2021-09-06 DIAGNOSIS — W57XXXA Bitten or stung by nonvenomous insect and other nonvenomous arthropods, initial encounter: Secondary | ICD-10-CM | POA: Diagnosis not present

## 2021-09-06 NOTE — ED Triage Notes (Signed)
Pt reports he removed a micro tick from upper inner thigh on Lt yesterday. ?
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
## 2021-09-06 NOTE — Discharge Instructions (Addendum)
You have been bitten by a tick. Most often, no illness will result though, rarely, a tick bite may cause disease in humans including: ?1) local infection ?2) [facility] spotted fever (RMSF) ?3) Lyme disease. ? ?Local (at the bite site) infections are usually caused by germs (bacteria) normally present on the skin that enter the germ-free layers beneath the skin. Any cut, scrape, bite, or break in the skin can result in local infection.  ? ?RMSF is a whole-body infection caused by bacteria carried by ticks. Bacteria enter the body when a tick bites a human to obtain a meal of blood. Very few tick bites, perhaps 1 in 100, spread this potentially very serious illness. ? ?Lyme disease is also a whole-body infection spread by tick bites. Lyme disease can be serious, but symptoms usually come on more slowly than RMSF. ? ?Expect some redness and swelling at the bite site that may persist for several days up to a few weeks. ? ?The incubation period (time between tick bite and start of RMSF) is between 2 to 14 days (average 7 days). ?Symptoms of RMSF include: fever, chills, headache, stiff neck, muscle aches, and skin rash. Some or all of these symptoms may be present. Should you develop any of these symptoms after a tick bite you must see a healthcare provider as soon as possible.  ? ?If you develop an odd-looking rash near the site of a tick bite with or without mild, nonspecific flu-like symptoms, see a healthcare provider as soon as possible. The diagnosis of Lyme disease can be considered and antibiotic treatment started if indicated.  ? ?If the site of your tick bite is bothering you, you may try an over-the-counter antihistamine loratadine (Claritin) or diphenhydramine (Benadryl). These medicines may help relieve itching, redness, and swelling. Take as directed on the packaging. ?You may also use a spray of local anesthetic that contains benzocaine, such as Solarcaine. This may help relieve mild pain. If your  skin reacts to the spray, stop using it immediately. ?Calamine lotion on the skin may also help relieve itching. ? ?Your blood pressure was noted to be elevated during your visit today. If you are currently taking medication for high blood pressure, please ensure you are taking this as directed. If you do not have a history of high blood pressure and your blood pressure remains persistently elevated, you may need to begin taking a medication at some point. You may return here within the next few days to recheck if unable to see your primary care provider or if you do not have a one. ? ?BP (!) 174/104   Pulse 64   Temp 98.4 ?F (36.9 ?C)   Resp 18   SpO2 96%  ? ?BP Readings from Last 3 Encounters:  ?09/06/21 (!) 174/104  ?07/05/21 (!) 152/102  ?06/10/21 (!) 142/78  ? ? ?

## 2021-09-10 ENCOUNTER — Ambulatory Visit (INDEPENDENT_AMBULATORY_CARE_PROVIDER_SITE_OTHER): Payer: 59 | Admitting: Registered Nurse

## 2021-09-10 ENCOUNTER — Encounter: Payer: Self-pay | Admitting: Registered Nurse

## 2021-09-10 ENCOUNTER — Other Ambulatory Visit: Payer: Self-pay

## 2021-09-10 VITALS — BP 162/98 | HR 94 | Temp 98.1°F | Resp 18 | Ht 69.0 in | Wt 161.0 lb

## 2021-09-10 DIAGNOSIS — I10 Essential (primary) hypertension: Secondary | ICD-10-CM | POA: Diagnosis not present

## 2021-09-10 NOTE — Patient Instructions (Addendum)
?  Mr. Arganbright -  ? ?See below for information on blood pressure ? ?Let's recheck in 3 months ? ?Thanks, ? ?Rich  ? ? ?If you have lab work done today you will be contacted with your lab results within the next 2 weeks.  If you have not heard from [place] then please contact us. The fastest way to get your results is to register for My Chart. ? ? ?IF you received an x-ray today, you will receive an invoice from [facility]. Please contact [facility] at [phone] with questions or concerns regarding your invoice.  ? ?IF you received labwork today, you will receive an invoice from [place]. Please contact [facility] at [phone] with questions or concerns regarding your invoice.  ? ?Our billing staff will not be able to assist you with questions regarding bills from these companies. ? ?You will be contacted with the lab results as soon as they are available. The fastest way to get your results is to activate your My Chart account. Instructions are located on the last page of this paperwork. If you have not heard from [place] regarding the results in 2 weeks, please contact this office. ?  ? ? ?

## 2021-09-10 NOTE — Progress Notes (Signed)
? ?Established Patient Office Visit ? ?Subjective:  ?Patient ID: Brandon Curtis, male    DOB: 1960-12-19  Age: 61 y.o. MRN: [id number] ? ?CC:  ?Chief Complaint  ?Patient presents with  ? Follow-up  ?  Patient states he is here for follow up on BP.  ? ? ?HPI ?Brandon Curtis presents for htn ? ?Hypertension: ?Patient Currently taking: no medication  ?Good effect. No AEs. ?Denies CV symptoms including: chest pain, shob, doe, headache, visual changes, fatigue, claudication, and dependent edema.  ? ?Previous readings and labs: ?BP Readings from Last 3 Encounters:  ?09/10/21 (!) 162/98  ?09/06/21 (!) 174/104  ?07/05/21 (!) 152/102  ? ?Lab Results  ?Component Value Date  ? CREATININE 0.92 07/09/2020  ? ? ? ? ?Outpatient Medications Prior to Visit  ?Medication Sig Dispense Refill  ? hydrocortisone cream 1 % Apply to affected area 2 times daily 15 g 0  ? ibuprofen (ADVIL) 400 MG tablet Take 1 tablet (400 mg total) by mouth every 8 (eight) hours as needed for up to 30 doses. 30 tablet 0  ? lidocaine (XYLOCAINE) 2 % solution Use as directed 15 mLs in the mouth or throat every 3 (three) hours as needed for mouth pain (Sore throat). 300 mL 0  ? meloxicam (MOBIC) 15 MG tablet TAKE 1 TABLET (15 MG TOTAL) BY MOUTH DAILY. 30 tablet 0  ? nystatin (MYCOSTATIN/NYSTOP) powder Apply 1 application topically 3 (three) times daily. 15 g 0  ? terbinafine (LAMISIL) 250 MG tablet Take 1 tablet (250 mg total) by mouth daily. 42 tablet 0  ? ?Facility-Administered Medications Prior to Visit  ?Medication Dose Route Frequency Provider Last Rate Last Admin  ? 0.9 %  sodium chloride infusion  500 mL Intravenous Once Rachael Fee, MD      ? ? ?Review of Systems  ?Constitutional: Negative.   ?HENT: Negative.    ?Eyes: Negative.   ?Respiratory: Negative.    ?Cardiovascular: Negative.   ?Gastrointestinal: Negative.   ?Genitourinary: Negative.   ?Musculoskeletal: Negative.   ?Skin: Negative.   ?Neurological: Negative.   ?Psychiatric/Behavioral:  Negative.    ?All other systems reviewed and are negative. ? ?  ?Objective:  ?  ? ?BP (!) 162/98   Pulse 94   Temp 98.1 ?F (36.7 ?C) (Temporal)   Resp 18   Ht 5\' 9"  (1.753 m)   Wt 161 lb (73 kg)   BMI 23.78 kg/m?  ? ?Wt Readings from Last 3 Encounters:  ?09/10/21 161 lb (73 kg)  ?06/10/21 168 lb (76.2 kg)  ?04/01/21 168 lb (76.2 kg)  ? ?Physical Exam ?Constitutional:   ?   General: He is not in acute distress. ?   Appearance: Normal appearance. He is normal weight. He is not ill-appearing, toxic-appearing or diaphoretic.  ?Cardiovascular:  ?   Rate and Rhythm: Normal rate and regular rhythm.  ?   Heart sounds: Normal heart sounds. No murmur heard. ?  No friction rub. No gallop.  ?Pulmonary:  ?   Effort: Pulmonary effort is normal. No respiratory distress.  ?   Breath sounds: Normal breath sounds. No stridor. No wheezing, rhonchi or rales.  ?Chest:  ?   Chest wall: No tenderness.  ?Neurological:  ?   General: No focal deficit present.  ?   Mental Status: He is alert and oriented to person, place, and time. Mental status is at baseline.  ?Psychiatric:     ?   Mood and Affect: Mood normal.     ?  Behavior: Behavior normal.     ?   Thought Content: Thought content normal.     ?   Judgment: Judgment normal.  ? ? ?No results found for any visits on 09/10/21. ? ? ? ?The 10-year ASCVD risk score (Arnett DK, et al., 2019) is: 12.6% ? ?  ?Assessment & Plan:  ? ?Problem List Items Addressed This Visit   ?None ?Visit Diagnoses   ? ? Primary hypertension    -  Primary  ? Relevant Orders  ? EKG 12-Lead (Completed)  ? CBC with Differential/Platelet  ? Comprehensive metabolic panel  ? Lipid panel  ? ?  ? ? ?No orders of the defined types were placed in this encounter. ? ? ?Return in about 3 months (around 12/10/2021) for Chronic Conditions.  ? ?PLAN ?Discussed increase in therapy with patient who declines despite stating awareness of risks.  ?Discussed in depth lifestyle modifications for improvement of BP. He voices  understanding and commitment to these changes.  ?Return within 3 mo to recheck ?Labs collected. Will follow up with the patient as warranted. ?Ekg today compared to 10/17/2017. No acute changes. Rrr.  ?Patient encouraged to call clinic with any questions, comments, or concerns. ? ?Janeece Agee, NP ?

## 2021-12-10 ENCOUNTER — Ambulatory Visit: Payer: 59 | Admitting: Registered Nurse

## 2022-05-03 IMAGING — MR MR FOOT*R* WO/W CM
4 of 9 series · 17 of 40 positions shown · IV contrast (multihance)
Comparison: None.

CLINICAL DATA: Foot mass along the dorsal aspect of the foot for 15
years. Burning sensation radiating up to the calf.

EXAM:
MRI OF THE RIGHT FOREFOOT WITHOUT AND WITH CONTRAST
TECHNIQUE: Multiplanar, multisequence MR imaging of the right forefoot was
performed before and after the administration of intravenous
contrast.
CONTRAST:  15mL MULTIHANCE GADOBENATE DIMEGLUMINE 529 MG/ML IV SOLN

[Series 4: T1 · coronal · 3.0mm · 0.19mm/px · 5 of 50 slices shown (1 of 2)]
[im 1/50]
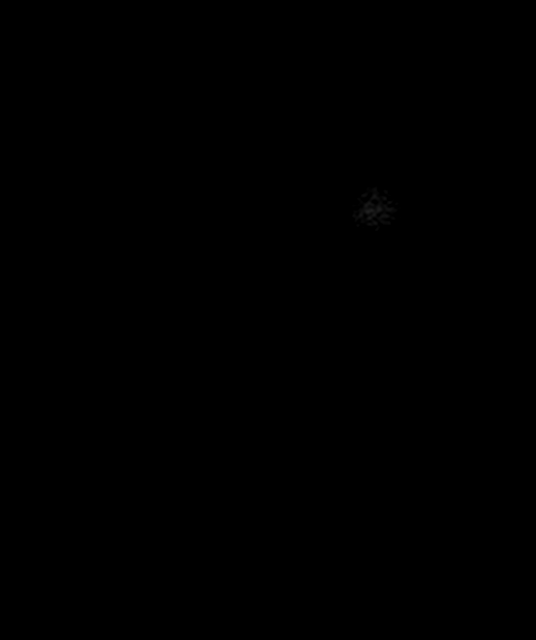
[im 13/50]
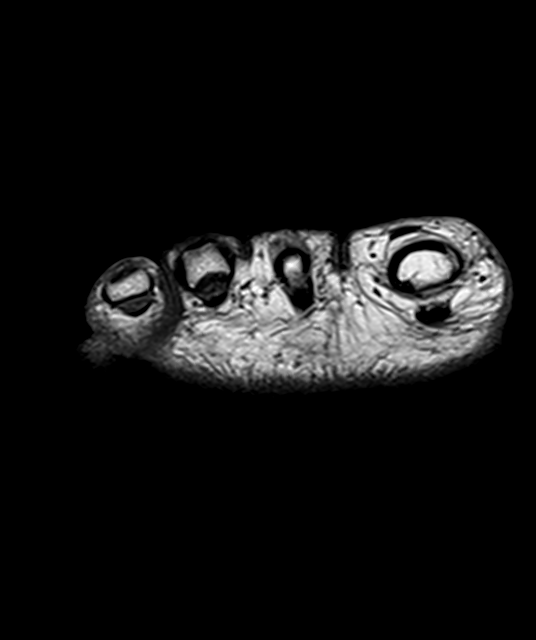
[im 25/50]
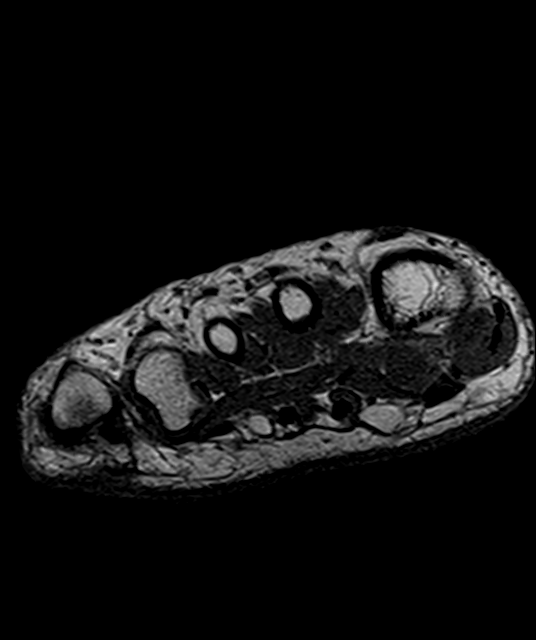
[im 37/50]
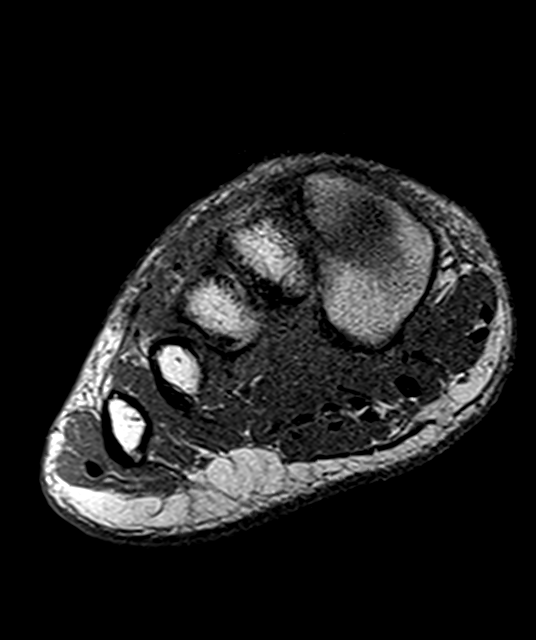
[im 50/50]
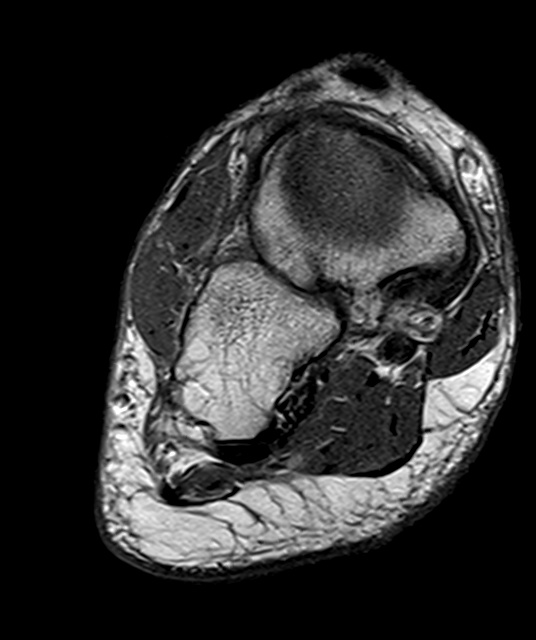

[Series 5: T2 fat-sat · coronal · 3.0mm · 0.19mm/px · 6 of 50 slices shown (1 of 2)]
[im 1/50]
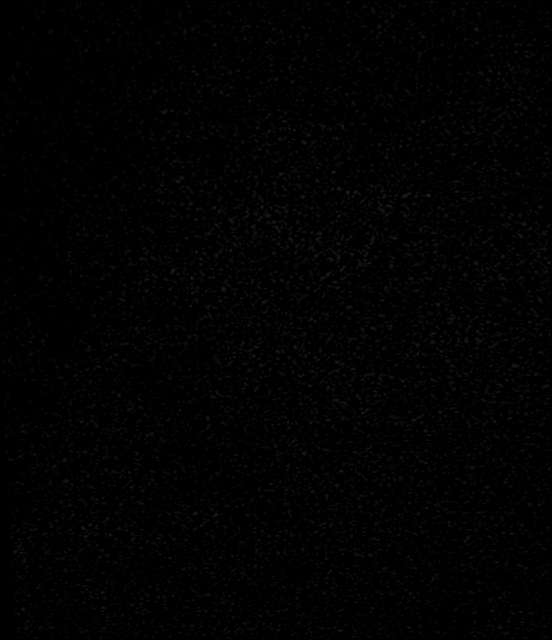
[im 10/50]
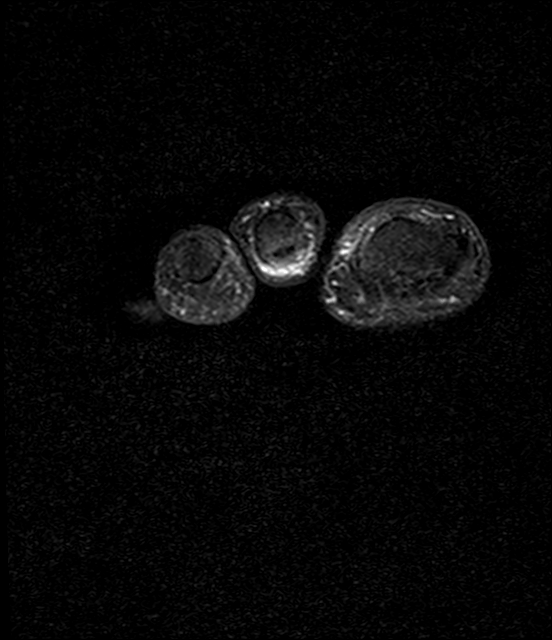
[im 20/50]
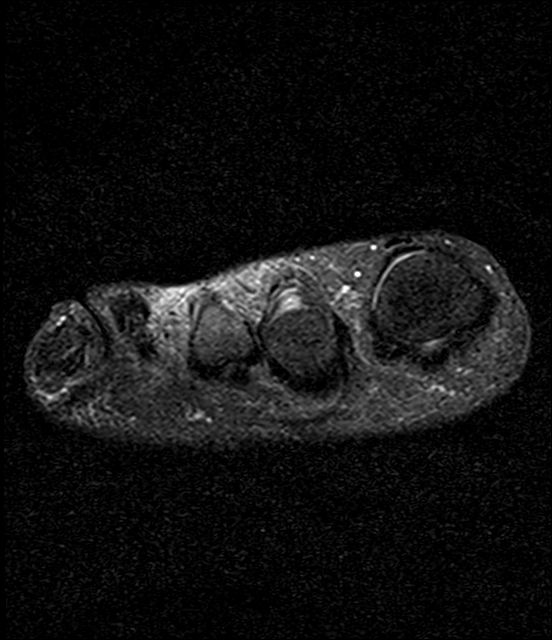
[im 30/50]
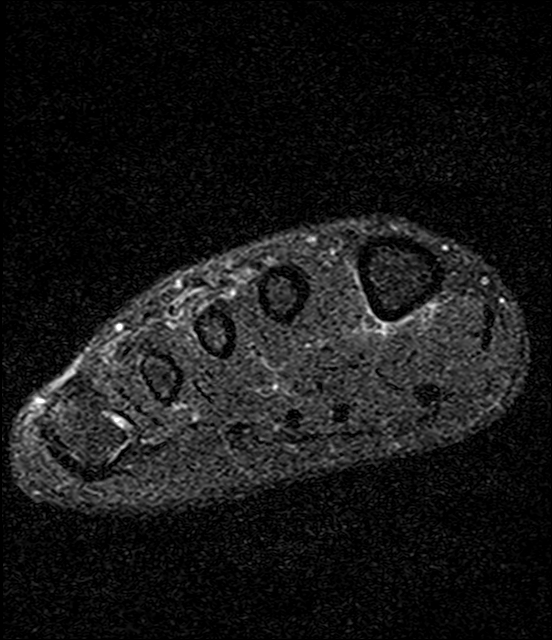
[im 40/50]
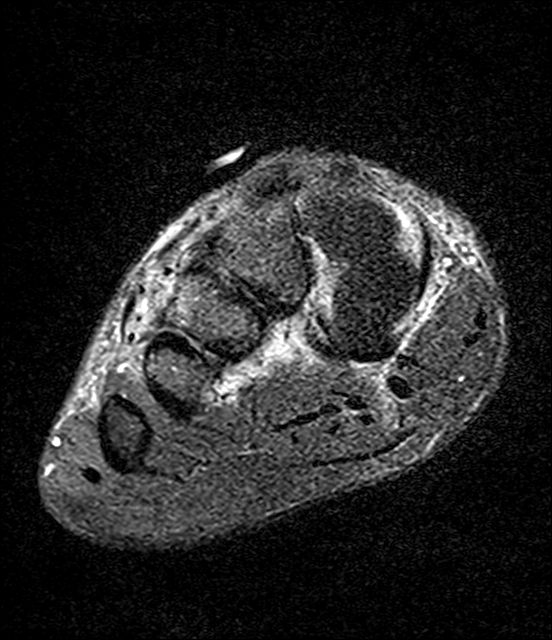
[im 50/50]
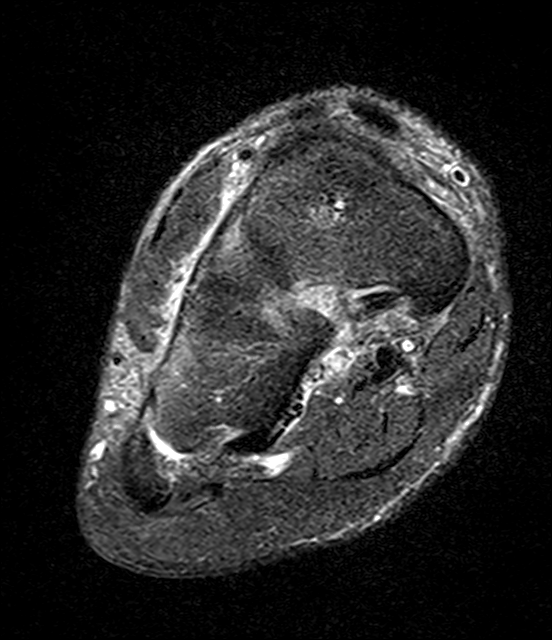

[Series 6: T2 fat-sat · axial · 3.0mm · 0.39mm/px · z∈[-99,-19]mm · 3 of 22 slices shown (2 of 2)]
[im 1/22]
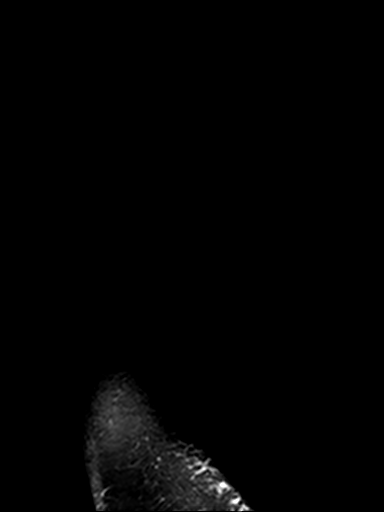
[im 11/22]
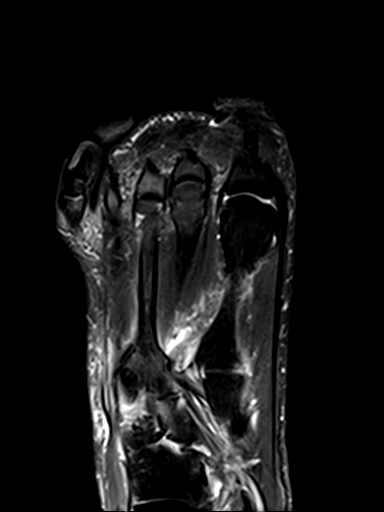
[im 22/22]
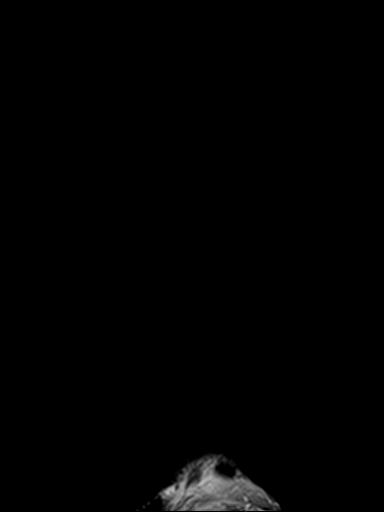

[Series 7: T1 · axial · 3.0mm · 0.39mm/px · z∈[-99,-19]mm · 3 of 22 slices shown (2 of 2)]
[im 1/22]
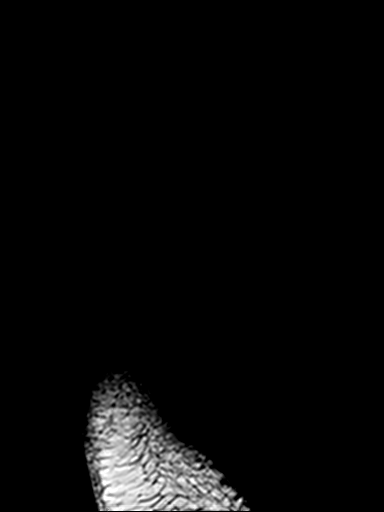
[im 11/22]
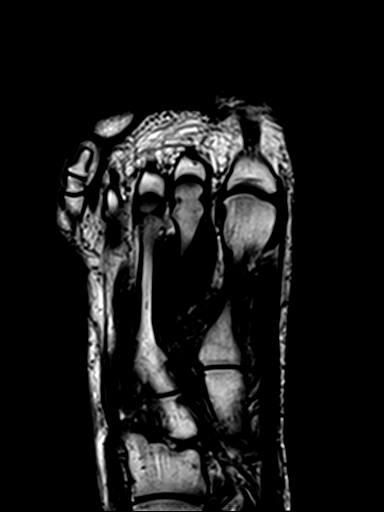
[im 22/22]
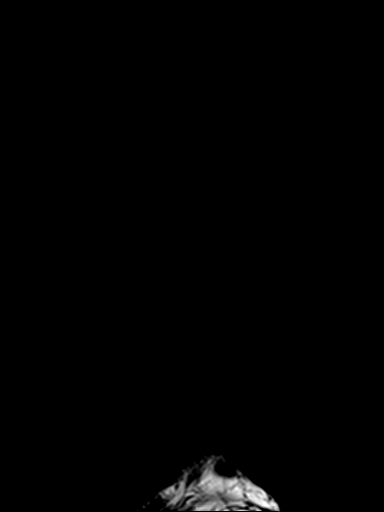

[17 of 40 positions shown; findings below may reference images not displayed]

FINDINGS: Bones/Joint/Cartilage

No fracture or dislocation. Normal alignment. No joint effusion.
Synovitis involving the tarsometatarsal joints of indeterminate
etiology.

Moderate osteoarthritis of the second tarsometatarsal joint with
subchondral marrow edema and dorsal marginal osteophytes which is at
the site of palpable abnormality which may account for the clinical
exam.

Mild osteoarthritis of the talonavicular joint with subchondral
reactive marrow changes in the navicular. Moderate osteoarthritis of
the third tarsometatarsal joint with subchondral reactive marrow
edema. Mild osteoarthritis of the fourth tarsometatarsal joint.

Ligaments

Collateral ligaments are intact.  Lisfranc ligament is intact.

Muscles and Tendons

Flexor, peroneal and extensor compartment tendons are intact.
Muscles are normal.

Soft tissue
No fluid collection or hematoma.  No soft tissue mass.
IMPRESSION: 1. Moderate osteoarthritis of the second tarsometatarsal joint with
subchondral marrow edema and dorsal marginal osteophytes which is at
the site of palpable abnormality which may account for the clinical
exam.
2. Mild osteoarthritis of the talonavicular joint with subchondral
reactive marrow changes in the navicular.
3. Moderate osteoarthritis of the third tarsometatarsal joint with
subchondral reactive marrow edema.
4. Nonspecific synovitis of the tarsometatarsal joints.
5. Mild osteoarthritis of the fourth tarsometatarsal joint.

## 2022-07-12 ENCOUNTER — Ambulatory Visit (HOSPITAL_COMMUNITY)
Admission: EM | Admit: 2022-07-12 | Discharge: 2022-07-12 | Disposition: A | Discharge status: Home / Self Care | Payer: 59 | Attending: Physician Assistant | Admitting: Physician Assistant

## 2022-07-12 ENCOUNTER — Ambulatory Visit (INDEPENDENT_AMBULATORY_CARE_PROVIDER_SITE_OTHER): Payer: 59

## 2022-07-12 ENCOUNTER — Encounter (HOSPITAL_COMMUNITY): Payer: Self-pay | Admitting: *Deleted

## 2022-07-12 ENCOUNTER — Other Ambulatory Visit: Payer: Self-pay

## 2022-07-12 DIAGNOSIS — M25512 Pain in left shoulder: Secondary | ICD-10-CM

## 2022-07-12 DIAGNOSIS — M79671 Pain in right foot: Secondary | ICD-10-CM | POA: Diagnosis not present

## 2022-07-12 DIAGNOSIS — I10 Essential (primary) hypertension: Secondary | ICD-10-CM | POA: Diagnosis not present

## 2022-07-12 DIAGNOSIS — M19012 Primary osteoarthritis, left shoulder: Secondary | ICD-10-CM

## 2022-07-12 MED ORDER — METHOCARBAMOL 500 MG PO TABS: 500.0000 mg | ORAL_TABLET | Freq: Two times a day (BID) | ORAL | 0 refills | Status: DC

## 2022-07-12 MED ORDER — MELOXICAM 15 MG PO TABS: 15.0000 mg | ORAL_TABLET | Freq: Every day | ORAL | 0 refills | Status: DC

## 2022-07-12 MED ORDER — LIDOCAINE 5 % EX PTCH
1.0000 | MEDICATED_PATCH | CUTANEOUS | 0 refills | Status: DC
Start: 2022-07-12 — End: 2022-07-29

## 2022-07-12 NOTE — ED Triage Notes (Signed)
Pt reports LT shoulder pain for 2 weeks.
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
## 2022-07-12 NOTE — Discharge Instructions (Signed)
Your x-ray showed arthritis which is likely causing your pain.  Please start meloxicam daily.  Do not take additional NSAIDs with this medication including aspirin, ibuprofen/Advil, naproxen/Aleve.  You can use acetaminophen/Tylenol.  Use heat and gentle stretch for symptom relief.  Take Robaxin up to twice a day.  This will make you sleepy so do not drive or drink alcohol while taking this medication.  You can use lidocaine patches on the area that is causing your pain.  Leave these on for 12 hours and then remove them for 12 hours; use only 1 patch per 24 hours.  I do recommend you follow-up with sports medicine; call to schedule an appointment.  If you have any worsening symptoms you should be seen immediately.  Your blood pressure is very elevated.  Please go home and take your medication.  Monitor this at home.  Follow-up with your primary care next week as discussed.  Avoid decongestants, caffeine, sodium.  If you develop any chest pain, shortness of breath, headache, vision change, dizziness in the setting of high blood pressure you need to go to the emergency room.

## 2022-07-13 ENCOUNTER — Ambulatory Visit (INDEPENDENT_AMBULATORY_CARE_PROVIDER_SITE_OTHER): Payer: 59 | Admitting: Family Medicine

## 2022-07-13 VITALS — BP 148/98 | Ht 69.0 in | Wt 165.0 lb

## 2022-07-13 DIAGNOSIS — M5010 Cervical disc disorder with radiculopathy, unspecified cervical region: Secondary | ICD-10-CM

## 2022-07-13 MED ORDER — PREDNISONE 10 MG PO TABS
ORAL_TABLET | ORAL | 0 refills | Status: DC
Start: 2022-07-13 — End: 2022-07-29
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
## 2022-07-13 NOTE — Patient Instructions (Addendum)
This is consistent with an irritated nerve from the lower part of your neck causing muscle spasms. Stop the meloxicam. Take prednisone dose pack as directed x 6 days. Heat as needed for spasms 15 minutes at a time. Call me in a week if not improving.

## 2022-07-14 ENCOUNTER — Encounter: Payer: Self-pay | Admitting: Family Medicine

## 2022-07-29 ENCOUNTER — Ambulatory Visit: Payer: 59 | Admitting: Internal Medicine

## 2022-07-29 ENCOUNTER — Encounter: Payer: Self-pay | Admitting: Internal Medicine

## 2022-07-29 VITALS — BP 150/103 | HR 82 | Ht 69.0 in | Wt 167.4 lb

## 2022-07-29 DIAGNOSIS — R9431 Abnormal electrocardiogram [ECG] [EKG]: Secondary | ICD-10-CM

## 2022-07-29 DIAGNOSIS — I10 Essential (primary) hypertension: Secondary | ICD-10-CM

## 2022-07-29 DIAGNOSIS — E782 Mixed hyperlipidemia: Secondary | ICD-10-CM

## 2022-07-29 DIAGNOSIS — R079 Chest pain, unspecified: Secondary | ICD-10-CM | POA: Insufficient documentation

## 2022-07-29 MED ORDER — METOPROLOL SUCCINATE ER 50 MG PO TB24: 50.0000 mg | ORAL_TABLET | Freq: Every day | ORAL | 2 refills | Status: AC

## 2022-07-29 MED ORDER — NITROGLYCERIN 0.4 MG SL SUBL: 0.4000 mg | SUBLINGUAL_TABLET | SUBLINGUAL | 3 refills | Status: AC | PRN
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
## 2022-08-04 ENCOUNTER — Ambulatory Visit: Payer: 59

## 2022-08-04 DIAGNOSIS — R9431 Abnormal electrocardiogram [ECG] [EKG]: Secondary | ICD-10-CM

## 2022-08-04 DIAGNOSIS — R079 Chest pain, unspecified: Secondary | ICD-10-CM

## 2022-08-04 DIAGNOSIS — I10 Essential (primary) hypertension: Secondary | ICD-10-CM

## 2022-08-06 NOTE — Progress Notes (Signed)
normal

## 2022-08-06 NOTE — Progress Notes (Signed)
Called patient to inform him about his echo results. Patient understood.

## 2022-08-11 ENCOUNTER — Encounter (HOSPITAL_COMMUNITY): Payer: Self-pay | Admitting: Emergency Medicine

## 2022-08-11 ENCOUNTER — Ambulatory Visit (HOSPITAL_COMMUNITY)
Admission: EM | Admit: 2022-08-11 | Discharge: 2022-08-11 | Disposition: A | Discharge status: Home / Self Care | Payer: 59 | Attending: Family Medicine | Admitting: Family Medicine

## 2022-08-11 DIAGNOSIS — Z202 Contact with and (suspected) exposure to infections with a predominantly sexual mode of transmission: Secondary | ICD-10-CM | POA: Insufficient documentation

## 2022-08-11 NOTE — Discharge Instructions (Signed)
Staff will notify you if there is anything positive on your test done with the swab today.
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
## 2022-08-12 LAB — CYTOLOGY, (ORAL, ANAL, URETHRAL) ANCILLARY ONLY
Chlamydia: NEGATIVE
Comment: NEGATIVE
Comment: NEGATIVE
Comment: NORMAL
Neisseria Gonorrhea: NEGATIVE
Trichomonas: NEGATIVE

## 2022-08-24 ENCOUNTER — Other Ambulatory Visit: Payer: 59

## 2022-09-01 ENCOUNTER — Ambulatory Visit: Payer: 59

## 2022-09-01 DIAGNOSIS — R079 Chest pain, unspecified: Secondary | ICD-10-CM

## 2022-09-01 DIAGNOSIS — I10 Essential (primary) hypertension: Secondary | ICD-10-CM

## 2022-09-01 DIAGNOSIS — R9431 Abnormal electrocardiogram [ECG] [EKG]: Secondary | ICD-10-CM

## 2022-09-15 ENCOUNTER — Ambulatory Visit: Payer: 59 | Admitting: Internal Medicine

## 2022-09-15 ENCOUNTER — Encounter: Payer: Self-pay | Admitting: Internal Medicine

## 2022-09-15 VITALS — BP 130/85 | HR 70 | Ht 69.0 in | Wt 168.0 lb

## 2022-09-15 DIAGNOSIS — E782 Mixed hyperlipidemia: Secondary | ICD-10-CM

## 2022-09-15 DIAGNOSIS — I10 Essential (primary) hypertension: Secondary | ICD-10-CM

## 2022-09-15 DIAGNOSIS — R9431 Abnormal electrocardiogram [ECG] [EKG]: Secondary | ICD-10-CM
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
## 2022-09-20 ENCOUNTER — Encounter (HOSPITAL_COMMUNITY): Payer: Self-pay | Admitting: Emergency Medicine

## 2022-09-20 ENCOUNTER — Ambulatory Visit (HOSPITAL_COMMUNITY)
Admission: EM | Admit: 2022-09-20 | Discharge: 2022-09-20 | Disposition: A | Discharge status: Home / Self Care | Payer: 59 | Attending: Family Medicine | Admitting: Family Medicine

## 2022-09-20 ENCOUNTER — Other Ambulatory Visit: Payer: Self-pay

## 2022-09-20 DIAGNOSIS — W57XXXA Bitten or stung by nonvenomous insect and other nonvenomous arthropods, initial encounter: Secondary | ICD-10-CM

## 2022-09-20 DIAGNOSIS — Z9189 Other specified personal risk factors, not elsewhere classified: Secondary | ICD-10-CM | POA: Diagnosis not present

## 2022-09-20 DIAGNOSIS — S1096XA Insect bite of unspecified part of neck, initial encounter: Secondary | ICD-10-CM | POA: Diagnosis not present

## 2022-09-20 MED ORDER — DOXYCYCLINE HYCLATE 100 MG PO CAPS: 100.0000 mg | ORAL_CAPSULE | Freq: Two times a day (BID) | ORAL | 0 refills | Status: AC

## 2022-09-20 NOTE — Discharge Instructions (Signed)
Take doxycycline 100 mg --1 capsule 2 times daily for 7 days  You can take Benadryl or Chlor-Trimeton according to package instructions for the itching.  The each can make you sleepy.

## 2022-09-20 NOTE — ED Triage Notes (Signed)
Reports he removed a tick from left clavicle area 2 days ago.  Patient reports swelling, redness, itchiness to site .  Patient denies any generalized issues.    Has not had any medications for symptoms
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
## 2023-07-01 ENCOUNTER — Other Ambulatory Visit: Payer: Self-pay | Admitting: Internal Medicine

## 2023-10-14 ENCOUNTER — Encounter (HOSPITAL_COMMUNITY): Payer: Self-pay

## 2023-10-14 ENCOUNTER — Ambulatory Visit (HOSPITAL_COMMUNITY)
Admission: RE | Admit: 2023-10-14 | Discharge: 2023-10-14 | Disposition: A | Discharge status: Home / Self Care | Source: Ambulatory Visit | Attending: Emergency Medicine | Admitting: Emergency Medicine

## 2023-10-14 VITALS — BP 156/92 | HR 81 | Temp 97.7°F | Resp 17

## 2023-10-14 DIAGNOSIS — W57XXXA Bitten or stung by nonvenomous insect and other nonvenomous arthropods, initial encounter: Secondary | ICD-10-CM

## 2023-10-14 DIAGNOSIS — A6920 Lyme disease, unspecified: Secondary | ICD-10-CM | POA: Diagnosis not present

## 2023-10-14 DIAGNOSIS — S20462A Insect bite (nonvenomous) of left back wall of thorax, initial encounter: Secondary | ICD-10-CM | POA: Diagnosis not present

## 2023-10-14 MED ORDER — DOXYCYCLINE HYCLATE 100 MG PO CAPS: 100.0000 mg | ORAL_CAPSULE | Freq: Two times a day (BID) | ORAL | 0 refills | Status: DC
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
## 2023-10-14 NOTE — Discharge Instructions (Addendum)
 Start taking doxycycline  twice daily for 10 days for Lyme disease coverage. Keep an eye on the area if it becomes larger, more painful, or you notice lots of puslike drainage from the area return here for reevaluation.   If you develop fever, lethargy, or bodyaches follow-up with primary care provider or return here for reevaluation.

## 2023-10-14 NOTE — ED Triage Notes (Signed)
 Pt reports that had tick on his back and had neighbor pull off 2 weeks ago. Reports having itching at site on his back.

## 2024-04-09 ENCOUNTER — Encounter (HOSPITAL_COMMUNITY): Payer: Self-pay

## 2024-04-09 ENCOUNTER — Ambulatory Visit (INDEPENDENT_AMBULATORY_CARE_PROVIDER_SITE_OTHER): Payer: Self-pay

## 2024-04-09 ENCOUNTER — Ambulatory Visit (HOSPITAL_COMMUNITY)
Admission: RE | Admit: 2024-04-09 | Discharge: 2024-04-09 | Disposition: A | Discharge status: Home / Self Care | Payer: Self-pay | Source: Ambulatory Visit | Attending: Nurse Practitioner | Admitting: Nurse Practitioner

## 2024-04-09 VITALS — BP 155/102 | HR 82 | Temp 97.9°F | Resp 16

## 2024-04-09 DIAGNOSIS — M79644 Pain in right finger(s): Secondary | ICD-10-CM

## 2024-04-09 DIAGNOSIS — T797XXA Traumatic subcutaneous emphysema, initial encounter: Secondary | ICD-10-CM

## 2024-04-09 DIAGNOSIS — S62521B Displaced fracture of distal phalanx of right thumb, initial encounter for open fracture: Secondary | ICD-10-CM

## 2024-04-09 MED ORDER — AMOXICILLIN-POT CLAVULANATE 875-125 MG PO TABS: 1.0000 | ORAL_TABLET | Freq: Two times a day (BID) | ORAL | 0 refills | Status: AC

## 2024-04-09 MED ORDER — BACITRACIN ZINC 500 UNIT/GM EX OINT
TOPICAL_OINTMENT | CUTANEOUS | Status: AC
Start: 2024-04-09 — End: 2024-04-09
  Filled 2024-04-09: qty 0.9
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
